# Patient Record
Sex: Female | Born: 1935 | Race: White | Hispanic: No | Smoking: Never smoker
Health system: Southern US, Community
[De-identification: ages and names within clinical notes are randomized; demographics above are authoritative.]

## PROBLEM LIST (undated history)

## (undated) DIAGNOSIS — D75839 Thrombocytosis, unspecified: Secondary | ICD-10-CM

## (undated) DIAGNOSIS — K5790 Diverticulosis of intestine, part unspecified, without perforation or abscess without bleeding: Secondary | ICD-10-CM

## (undated) DIAGNOSIS — I10 Essential (primary) hypertension: Secondary | ICD-10-CM

## (undated) DIAGNOSIS — N189 Chronic kidney disease, unspecified: Secondary | ICD-10-CM

## (undated) DIAGNOSIS — M810 Age-related osteoporosis without current pathological fracture: Secondary | ICD-10-CM

## (undated) DIAGNOSIS — D473 Essential (hemorrhagic) thrombocythemia: Secondary | ICD-10-CM

## (undated) DIAGNOSIS — I214 Non-ST elevation (NSTEMI) myocardial infarction: Secondary | ICD-10-CM

## (undated) DIAGNOSIS — K922 Gastrointestinal hemorrhage, unspecified: Secondary | ICD-10-CM

## (undated) DIAGNOSIS — I2510 Atherosclerotic heart disease of native coronary artery without angina pectoris: Secondary | ICD-10-CM

## (undated) DIAGNOSIS — G459 Transient cerebral ischemic attack, unspecified: Secondary | ICD-10-CM

## (undated) DIAGNOSIS — K219 Gastro-esophageal reflux disease without esophagitis: Secondary | ICD-10-CM

## (undated) DIAGNOSIS — D649 Anemia, unspecified: Secondary | ICD-10-CM

## (undated) HISTORY — PX: ABDOMINAL HYSTERECTOMY: SHX81

## (undated) HISTORY — DX: Essential (hemorrhagic) thrombocythemia: D47.3

## (undated) HISTORY — PX: COLONOSCOPY: SHX174

---

## 2003-09-11 ENCOUNTER — Other Ambulatory Visit: Payer: Self-pay

## 2004-03-25 ENCOUNTER — Other Ambulatory Visit: Payer: Self-pay

## 2004-05-31 ENCOUNTER — Ambulatory Visit: Payer: Self-pay | Admitting: Internal Medicine

## 2004-07-01 ENCOUNTER — Ambulatory Visit: Payer: Self-pay | Admitting: Internal Medicine

## 2004-07-31 ENCOUNTER — Ambulatory Visit: Payer: Self-pay | Admitting: Internal Medicine

## 2004-08-31 ENCOUNTER — Ambulatory Visit: Payer: Self-pay | Admitting: Internal Medicine

## 2004-10-01 ENCOUNTER — Ambulatory Visit: Payer: Self-pay | Admitting: Internal Medicine

## 2004-10-29 ENCOUNTER — Ambulatory Visit: Payer: Self-pay | Admitting: Internal Medicine

## 2004-11-29 ENCOUNTER — Ambulatory Visit: Payer: Self-pay | Admitting: Internal Medicine

## 2004-12-29 ENCOUNTER — Ambulatory Visit: Payer: Self-pay | Admitting: Internal Medicine

## 2005-01-29 ENCOUNTER — Ambulatory Visit: Payer: Self-pay | Admitting: Internal Medicine

## 2005-02-28 ENCOUNTER — Ambulatory Visit: Payer: Self-pay | Admitting: Internal Medicine

## 2005-04-07 ENCOUNTER — Ambulatory Visit: Payer: Self-pay | Admitting: Internal Medicine

## 2005-05-01 ENCOUNTER — Ambulatory Visit: Payer: Self-pay | Admitting: Internal Medicine

## 2005-06-02 ENCOUNTER — Ambulatory Visit: Payer: Self-pay | Admitting: Internal Medicine

## 2005-07-01 ENCOUNTER — Ambulatory Visit: Payer: Self-pay | Admitting: Internal Medicine

## 2005-07-31 ENCOUNTER — Ambulatory Visit: Payer: Self-pay | Admitting: Internal Medicine

## 2005-09-15 ENCOUNTER — Ambulatory Visit: Payer: Self-pay | Admitting: Internal Medicine

## 2005-10-01 ENCOUNTER — Ambulatory Visit: Payer: Self-pay | Admitting: Internal Medicine

## 2005-11-03 ENCOUNTER — Ambulatory Visit: Payer: Self-pay | Admitting: Internal Medicine

## 2005-11-29 ENCOUNTER — Ambulatory Visit: Payer: Self-pay | Admitting: Internal Medicine

## 2005-12-29 ENCOUNTER — Ambulatory Visit: Payer: Self-pay | Admitting: Internal Medicine

## 2006-01-29 ENCOUNTER — Ambulatory Visit: Payer: Self-pay | Admitting: Internal Medicine

## 2006-02-28 ENCOUNTER — Ambulatory Visit: Payer: Self-pay | Admitting: Internal Medicine

## 2006-03-26 ENCOUNTER — Ambulatory Visit: Payer: Self-pay | Admitting: Unknown Physician Specialty

## 2006-04-16 ENCOUNTER — Ambulatory Visit: Payer: Self-pay | Admitting: Internal Medicine

## 2006-05-01 ENCOUNTER — Ambulatory Visit: Payer: Self-pay | Admitting: Internal Medicine

## 2006-06-15 ENCOUNTER — Ambulatory Visit: Payer: Self-pay | Admitting: Internal Medicine

## 2006-07-01 ENCOUNTER — Ambulatory Visit: Payer: Self-pay | Admitting: Internal Medicine

## 2006-08-10 ENCOUNTER — Ambulatory Visit: Payer: Self-pay | Admitting: Internal Medicine

## 2006-08-31 ENCOUNTER — Ambulatory Visit: Payer: Self-pay | Admitting: Internal Medicine

## 2006-09-13 ENCOUNTER — Other Ambulatory Visit: Payer: Self-pay

## 2006-09-13 ENCOUNTER — Inpatient Hospital Stay: Payer: Self-pay | Admitting: Internal Medicine

## 2006-09-15 ENCOUNTER — Other Ambulatory Visit: Payer: Self-pay

## 2006-10-05 ENCOUNTER — Ambulatory Visit: Payer: Self-pay | Admitting: Internal Medicine

## 2006-10-30 ENCOUNTER — Ambulatory Visit: Payer: Self-pay | Admitting: Internal Medicine

## 2006-11-30 ENCOUNTER — Ambulatory Visit: Payer: Self-pay | Admitting: Internal Medicine

## 2006-12-30 ENCOUNTER — Ambulatory Visit: Payer: Self-pay | Admitting: Internal Medicine

## 2007-01-30 ENCOUNTER — Ambulatory Visit: Payer: Self-pay | Admitting: Internal Medicine

## 2007-03-01 ENCOUNTER — Ambulatory Visit: Payer: Self-pay | Admitting: Internal Medicine

## 2007-04-01 ENCOUNTER — Ambulatory Visit: Payer: Self-pay | Admitting: Internal Medicine

## 2007-05-31 ENCOUNTER — Ambulatory Visit: Payer: Self-pay | Admitting: Internal Medicine

## 2007-06-01 ENCOUNTER — Ambulatory Visit: Payer: Self-pay | Admitting: Internal Medicine

## 2007-07-26 ENCOUNTER — Ambulatory Visit: Payer: Self-pay | Admitting: Internal Medicine

## 2007-08-01 ENCOUNTER — Ambulatory Visit: Payer: Self-pay | Admitting: Internal Medicine

## 2007-09-26 ENCOUNTER — Ambulatory Visit: Payer: Self-pay | Admitting: Internal Medicine

## 2007-10-02 ENCOUNTER — Ambulatory Visit: Payer: Self-pay | Admitting: Internal Medicine

## 2007-11-29 ENCOUNTER — Ambulatory Visit: Payer: Self-pay | Admitting: Internal Medicine

## 2007-11-30 ENCOUNTER — Ambulatory Visit: Payer: Self-pay | Admitting: Internal Medicine

## 2008-01-24 ENCOUNTER — Ambulatory Visit: Payer: Self-pay | Admitting: Internal Medicine

## 2008-01-30 ENCOUNTER — Ambulatory Visit: Payer: Self-pay | Admitting: Internal Medicine

## 2008-03-20 ENCOUNTER — Ambulatory Visit: Payer: Self-pay | Admitting: Internal Medicine

## 2008-03-31 ENCOUNTER — Ambulatory Visit: Payer: Self-pay | Admitting: Internal Medicine

## 2008-05-22 ENCOUNTER — Ambulatory Visit: Payer: Self-pay | Admitting: Internal Medicine

## 2008-05-31 ENCOUNTER — Ambulatory Visit: Payer: Self-pay | Admitting: Internal Medicine

## 2008-07-01 ENCOUNTER — Ambulatory Visit: Payer: Self-pay | Admitting: Internal Medicine

## 2008-07-17 ENCOUNTER — Ambulatory Visit: Payer: Self-pay | Admitting: Internal Medicine

## 2008-07-31 ENCOUNTER — Ambulatory Visit: Payer: Self-pay | Admitting: Internal Medicine

## 2008-08-31 ENCOUNTER — Ambulatory Visit: Payer: Self-pay | Admitting: Internal Medicine

## 2008-09-18 ENCOUNTER — Ambulatory Visit: Payer: Self-pay | Admitting: Internal Medicine

## 2008-10-01 ENCOUNTER — Ambulatory Visit: Payer: Self-pay | Admitting: Internal Medicine

## 2008-10-29 ENCOUNTER — Ambulatory Visit: Payer: Self-pay | Admitting: Internal Medicine

## 2008-11-13 ENCOUNTER — Ambulatory Visit: Payer: Self-pay | Admitting: Internal Medicine

## 2008-11-29 ENCOUNTER — Ambulatory Visit: Payer: Self-pay | Admitting: Internal Medicine

## 2008-12-29 ENCOUNTER — Ambulatory Visit: Payer: Self-pay | Admitting: Internal Medicine

## 2009-01-16 ENCOUNTER — Ambulatory Visit: Payer: Self-pay | Admitting: Ophthalmology

## 2009-01-16 ENCOUNTER — Ambulatory Visit: Payer: Self-pay | Admitting: Cardiology

## 2009-01-29 ENCOUNTER — Ambulatory Visit: Payer: Self-pay | Admitting: Ophthalmology

## 2009-01-30 ENCOUNTER — Ambulatory Visit: Payer: Self-pay | Admitting: Internal Medicine

## 2009-02-28 ENCOUNTER — Ambulatory Visit: Payer: Self-pay | Admitting: Internal Medicine

## 2009-03-31 ENCOUNTER — Ambulatory Visit: Payer: Self-pay | Admitting: Internal Medicine

## 2009-05-01 ENCOUNTER — Ambulatory Visit: Payer: Self-pay | Admitting: Internal Medicine

## 2009-05-31 ENCOUNTER — Ambulatory Visit: Payer: Self-pay | Admitting: Internal Medicine

## 2009-07-19 ENCOUNTER — Ambulatory Visit: Payer: Self-pay | Admitting: Internal Medicine

## 2009-07-31 ENCOUNTER — Ambulatory Visit: Payer: Self-pay | Admitting: Internal Medicine

## 2009-08-31 ENCOUNTER — Ambulatory Visit: Payer: Self-pay | Admitting: Internal Medicine

## 2009-09-19 ENCOUNTER — Ambulatory Visit: Payer: Self-pay | Admitting: Internal Medicine

## 2009-10-01 ENCOUNTER — Ambulatory Visit: Payer: Self-pay | Admitting: Internal Medicine

## 2009-10-29 ENCOUNTER — Ambulatory Visit: Payer: Self-pay | Admitting: Internal Medicine

## 2009-11-29 ENCOUNTER — Ambulatory Visit: Payer: Self-pay | Admitting: Internal Medicine

## 2010-01-09 ENCOUNTER — Ambulatory Visit: Payer: Self-pay | Admitting: Internal Medicine

## 2010-01-29 ENCOUNTER — Ambulatory Visit: Payer: Self-pay | Admitting: Internal Medicine

## 2010-02-28 ENCOUNTER — Ambulatory Visit: Payer: Self-pay | Admitting: Internal Medicine

## 2010-04-17 ENCOUNTER — Ambulatory Visit: Payer: Self-pay | Admitting: Internal Medicine

## 2010-05-01 ENCOUNTER — Ambulatory Visit: Payer: Self-pay | Admitting: Internal Medicine

## 2010-06-17 ENCOUNTER — Ambulatory Visit: Payer: Self-pay | Admitting: Internal Medicine

## 2010-07-01 ENCOUNTER — Ambulatory Visit: Payer: Self-pay | Admitting: Internal Medicine

## 2010-08-18 ENCOUNTER — Ambulatory Visit: Payer: Self-pay | Admitting: Internal Medicine

## 2010-08-31 ENCOUNTER — Ambulatory Visit: Payer: Self-pay | Admitting: Internal Medicine

## 2010-10-20 ENCOUNTER — Ambulatory Visit: Payer: Self-pay | Admitting: Internal Medicine

## 2010-10-30 ENCOUNTER — Ambulatory Visit: Payer: Self-pay | Admitting: Internal Medicine

## 2010-12-19 ENCOUNTER — Ambulatory Visit: Payer: Self-pay | Admitting: Internal Medicine

## 2010-12-30 ENCOUNTER — Ambulatory Visit: Payer: Self-pay | Admitting: Internal Medicine

## 2011-02-18 ENCOUNTER — Ambulatory Visit: Payer: Self-pay | Admitting: Internal Medicine

## 2011-03-01 ENCOUNTER — Ambulatory Visit: Payer: Self-pay | Admitting: Internal Medicine

## 2011-04-22 ENCOUNTER — Ambulatory Visit: Payer: Self-pay | Admitting: Internal Medicine

## 2011-05-02 ENCOUNTER — Ambulatory Visit: Payer: Self-pay | Admitting: Internal Medicine

## 2011-06-01 ENCOUNTER — Ambulatory Visit: Payer: Self-pay | Admitting: Internal Medicine

## 2011-07-02 ENCOUNTER — Ambulatory Visit: Payer: Self-pay | Admitting: Internal Medicine

## 2011-08-01 ENCOUNTER — Ambulatory Visit: Payer: Self-pay | Admitting: Internal Medicine

## 2011-08-05 ENCOUNTER — Ambulatory Visit: Payer: Self-pay | Admitting: Internal Medicine

## 2011-09-01 ENCOUNTER — Ambulatory Visit: Payer: Self-pay | Admitting: Internal Medicine

## 2011-10-06 ENCOUNTER — Ambulatory Visit: Payer: Self-pay | Admitting: Internal Medicine

## 2011-10-06 LAB — CBC CANCER CENTER
Basophil #: 0 x10 3/mm (ref 0.0–0.1)
Eosinophil #: 0 x10 3/mm (ref 0.0–0.7)
MCH: 39.3 pg — ABNORMAL HIGH (ref 26.0–34.0)
MCHC: 34.8 g/dL (ref 32.0–36.0)
MCV: 113 fL — ABNORMAL HIGH (ref 80–100)
Monocyte #: 0.3 x10 3/mm (ref 0.0–0.7)
Neutrophil %: 68.5 %
Platelet: 429 x10 3/mm (ref 150–440)

## 2011-10-30 ENCOUNTER — Ambulatory Visit: Payer: Self-pay | Admitting: Internal Medicine

## 2011-12-04 ENCOUNTER — Ambulatory Visit: Payer: Self-pay | Admitting: Internal Medicine

## 2011-12-04 LAB — CBC CANCER CENTER
Basophil %: 0.3 %
Eosinophil %: 0.5 %
Lymphocyte %: 28.4 %
MCH: 38.9 pg — ABNORMAL HIGH (ref 26.0–34.0)
Monocyte %: 8.2 %
Platelet: 350 x10 3/mm (ref 150–440)
RBC: 2.69 10*6/uL — ABNORMAL LOW (ref 3.80–5.20)
WBC: 3.1 x10 3/mm — ABNORMAL LOW (ref 3.6–11.0)

## 2011-12-30 ENCOUNTER — Ambulatory Visit: Payer: Self-pay | Admitting: Internal Medicine

## 2012-01-01 LAB — CBC CANCER CENTER
Eosinophil %: 0.7 %
HCT: 29.1 % — ABNORMAL LOW (ref 35.0–47.0)
Lymphocyte #: 1 x10 3/mm (ref 1.0–3.6)
Lymphocyte %: 29.2 %
MCH: 38 pg — ABNORMAL HIGH (ref 26.0–34.0)
MCHC: 32.8 g/dL (ref 32.0–36.0)
Monocyte #: 0.4 x10 3/mm (ref 0.2–0.9)
Monocyte %: 10.8 %
Neutrophil %: 59 %
Platelet: 298 x10 3/mm (ref 150–440)
RBC: 2.51 10*6/uL — ABNORMAL LOW (ref 3.80–5.20)
RDW: 13.7 % (ref 11.5–14.5)
WBC: 3.3 x10 3/mm — ABNORMAL LOW (ref 3.6–11.0)

## 2012-01-15 LAB — CBC CANCER CENTER
Basophil #: 0 x10 3/mm (ref 0.0–0.1)
Eosinophil %: 0.7 %
HCT: 29.3 % — ABNORMAL LOW (ref 35.0–47.0)
HGB: 9.8 g/dL — ABNORMAL LOW (ref 12.0–16.0)
Lymphocyte #: 0.9 x10 3/mm — ABNORMAL LOW (ref 1.0–3.6)
Lymphocyte %: 28.5 %
MCH: 38.7 pg — ABNORMAL HIGH (ref 26.0–34.0)
MCHC: 33.5 g/dL (ref 32.0–36.0)
Monocyte %: 10.2 %
Platelet: 329 x10 3/mm (ref 150–440)
RBC: 2.53 10*6/uL — ABNORMAL LOW (ref 3.80–5.20)
WBC: 3.2 x10 3/mm — ABNORMAL LOW (ref 3.6–11.0)

## 2012-01-15 LAB — IRON AND TIBC
Iron Bind.Cap.(Total): 319 ug/dL (ref 250–450)
Iron: 86 ug/dL (ref 50–170)
Unbound Iron-Bind.Cap.: 233 ug/dL

## 2012-01-29 LAB — CBC CANCER CENTER
Basophil #: 0 x10 3/mm (ref 0.0–0.1)
Eosinophil %: 0.8 %
Lymphocyte #: 1 x10 3/mm (ref 1.0–3.6)
MCH: 38.3 pg — ABNORMAL HIGH (ref 26.0–34.0)
Monocyte %: 10.1 %
Neutrophil %: 57.1 %
Platelet: 279 x10 3/mm (ref 150–440)
RBC: 2.7 10*6/uL — ABNORMAL LOW (ref 3.80–5.20)
WBC: 3.1 x10 3/mm — ABNORMAL LOW (ref 3.6–11.0)

## 2012-01-30 ENCOUNTER — Ambulatory Visit: Payer: Self-pay | Admitting: Internal Medicine

## 2012-02-15 LAB — CBC CANCER CENTER
Basophil #: 0 x10 3/mm (ref 0.0–0.1)
Eosinophil #: 0 x10 3/mm (ref 0.0–0.7)
Eosinophil %: 0.8 %
HCT: 30.7 % — ABNORMAL LOW (ref 35.0–47.0)
HGB: 10.2 g/dL — ABNORMAL LOW (ref 12.0–16.0)
Lymphocyte #: 0.9 x10 3/mm — ABNORMAL LOW (ref 1.0–3.6)
Lymphocyte %: 17.5 %
MCH: 37.9 pg — ABNORMAL HIGH (ref 26.0–34.0)
MCHC: 33.1 g/dL (ref 32.0–36.0)
Neutrophil %: 72.3 %
Platelet: 335 x10 3/mm (ref 150–440)

## 2012-02-29 ENCOUNTER — Ambulatory Visit: Payer: Self-pay | Admitting: Internal Medicine

## 2012-03-07 LAB — CBC CANCER CENTER
Basophil #: 0 x10 3/mm (ref 0.0–0.1)
Basophil %: 0.5 %
Eosinophil #: 0 x10 3/mm (ref 0.0–0.7)
HGB: 10.5 g/dL — ABNORMAL LOW (ref 12.0–16.0)
Lymphocyte #: 1.1 x10 3/mm (ref 1.0–3.6)
MCH: 38 pg — ABNORMAL HIGH (ref 26.0–34.0)
MCHC: 33 g/dL (ref 32.0–36.0)
MCV: 115 fL — ABNORMAL HIGH (ref 80–100)
Monocyte #: 0.4 x10 3/mm (ref 0.2–0.9)
Monocyte %: 9.4 %
Platelet: 380 x10 3/mm (ref 150–440)
RDW: 12.8 % (ref 11.5–14.5)
WBC: 3.9 x10 3/mm (ref 3.6–11.0)

## 2012-03-31 ENCOUNTER — Ambulatory Visit: Payer: Self-pay | Admitting: Internal Medicine

## 2012-04-04 LAB — CBC CANCER CENTER
Basophil #: 0 x10 3/mm (ref 0.0–0.1)
Basophil %: 0.5 %
Eosinophil #: 0 x10 3/mm (ref 0.0–0.7)
HCT: 33.8 % — ABNORMAL LOW (ref 35.0–47.0)
HGB: 11.5 g/dL — ABNORMAL LOW (ref 12.0–16.0)
Lymphocyte #: 1 x10 3/mm (ref 1.0–3.6)
Lymphocyte %: 22.4 %
MCH: 38.4 pg — ABNORMAL HIGH (ref 26.0–34.0)
MCHC: 34.1 g/dL (ref 32.0–36.0)
Monocyte #: 0.3 x10 3/mm (ref 0.2–0.9)
Monocyte %: 7 %
Neutrophil #: 3.2 x10 3/mm (ref 1.4–6.5)
RDW: 12.2 % (ref 11.5–14.5)
WBC: 4.6 x10 3/mm (ref 3.6–11.0)

## 2012-05-01 ENCOUNTER — Ambulatory Visit: Payer: Self-pay | Admitting: Internal Medicine

## 2012-05-30 LAB — CBC CANCER CENTER
Basophil #: 0 x10 3/mm (ref 0.0–0.1)
Basophil %: 0.7 %
Eosinophil #: 0.1 x10 3/mm (ref 0.0–0.7)
HCT: 31.7 % — ABNORMAL LOW (ref 35.0–47.0)
Lymphocyte #: 1.2 x10 3/mm (ref 1.0–3.6)
MCH: 36.7 pg — ABNORMAL HIGH (ref 26.0–34.0)
MCV: 110 fL — ABNORMAL HIGH (ref 80–100)
Monocyte %: 9.9 %
Neutrophil #: 3.4 x10 3/mm (ref 1.4–6.5)
RBC: 2.87 10*6/uL — ABNORMAL LOW (ref 3.80–5.20)
RDW: 13.4 % (ref 11.5–14.5)
WBC: 5.2 x10 3/mm (ref 3.6–11.0)

## 2012-05-31 ENCOUNTER — Ambulatory Visit: Payer: Self-pay | Admitting: Internal Medicine

## 2012-07-11 ENCOUNTER — Ambulatory Visit: Payer: Self-pay | Admitting: Internal Medicine

## 2012-07-11 LAB — CBC CANCER CENTER
Basophil #: 0 x10 3/mm (ref 0.0–0.1)
Basophil %: 0.7 %
Eosinophil #: 0.1 x10 3/mm (ref 0.0–0.7)
HGB: 10.9 g/dL — ABNORMAL LOW (ref 12.0–16.0)
Lymphocyte %: 23.3 %
MCH: 36.7 pg — ABNORMAL HIGH (ref 26.0–34.0)
MCHC: 33.4 g/dL (ref 32.0–36.0)
MCV: 110 fL — ABNORMAL HIGH (ref 80–100)
Monocyte #: 0.5 x10 3/mm (ref 0.2–0.9)
Monocyte %: 9.5 %
Neutrophil %: 64.5 %
RDW: 13.9 % (ref 11.5–14.5)
WBC: 4.8 x10 3/mm (ref 3.6–11.0)

## 2012-07-31 ENCOUNTER — Ambulatory Visit: Payer: Self-pay | Admitting: Internal Medicine

## 2012-08-27 ENCOUNTER — Inpatient Hospital Stay: Payer: Self-pay

## 2012-08-27 LAB — COMPREHENSIVE METABOLIC PANEL
Alkaline Phosphatase: 80 U/L (ref 50–136)
Anion Gap: 9 (ref 7–16)
Bilirubin,Total: 0.5 mg/dL (ref 0.2–1.0)
Chloride: 107 mmol/L (ref 98–107)
Co2: 25 mmol/L (ref 21–32)
Creatinine: 1.31 mg/dL — ABNORMAL HIGH (ref 0.60–1.30)
EGFR (Non-African Amer.): 36 — ABNORMAL LOW
Osmolality: 286 (ref 275–301)
Potassium: 3.6 mmol/L (ref 3.5–5.1)
SGPT (ALT): 15 U/L (ref 12–78)
Sodium: 141 mmol/L (ref 136–145)
Total Protein: 7.7 g/dL (ref 6.4–8.2)

## 2012-08-27 LAB — URINALYSIS, COMPLETE
Hyaline Cast: 3
Ketone: NEGATIVE
Nitrite: NEGATIVE
Protein: 100
RBC,UR: 1 /HPF (ref 0–5)
Specific Gravity: 1.013 (ref 1.003–1.030)
Squamous Epithelial: 2
WBC UR: 4 /HPF (ref 0–5)

## 2012-08-27 LAB — CBC
MCH: 37 pg — ABNORMAL HIGH (ref 26.0–34.0)
MCHC: 34.3 g/dL (ref 32.0–36.0)
Platelet: 414 10*3/uL (ref 150–440)
RBC: 3.24 10*6/uL — ABNORMAL LOW (ref 3.80–5.20)
WBC: 4.2 10*3/uL (ref 3.6–11.0)

## 2012-08-27 LAB — LIPASE, BLOOD: Lipase: 310 U/L (ref 73–393)

## 2012-08-27 LAB — CK TOTAL AND CKMB (NOT AT ARMC)
CK, Total: 36 U/L (ref 21–215)
CK-MB: 1.3 ng/mL (ref 0.5–3.6)

## 2012-08-27 LAB — TROPONIN I: Troponin-I: 0.06 ng/mL — ABNORMAL HIGH

## 2012-08-27 LAB — TSH: Thyroid Stimulating Horm: 5.52 u[IU]/mL — ABNORMAL HIGH

## 2012-08-27 LAB — PRO B NATRIURETIC PEPTIDE: B-Type Natriuretic Peptide: 1523 pg/mL — ABNORMAL HIGH (ref 0–450)

## 2012-08-28 LAB — CBC WITH DIFFERENTIAL/PLATELET
Basophil %: 0.6 %
Eosinophil #: 0.1 10*3/uL (ref 0.0–0.7)
Eosinophil %: 1.4 %
HGB: 10.8 g/dL — ABNORMAL LOW (ref 12.0–16.0)
Lymphocyte %: 22 %
MCH: 36.2 pg — ABNORMAL HIGH (ref 26.0–34.0)
MCHC: 33.7 g/dL (ref 32.0–36.0)
Neutrophil #: 3.8 10*3/uL (ref 1.4–6.5)
Neutrophil %: 65.5 %
Platelet: 402 10*3/uL (ref 150–440)
RDW: 14 % (ref 11.5–14.5)

## 2012-08-28 LAB — BASIC METABOLIC PANEL
Chloride: 105 mmol/L (ref 98–107)
EGFR (Non-African Amer.): 33 — ABNORMAL LOW
Osmolality: 292 (ref 275–301)
Potassium: 3.3 mmol/L — ABNORMAL LOW (ref 3.5–5.1)
Sodium: 143 mmol/L (ref 136–145)

## 2012-08-28 LAB — CK-MB: CK-MB: 20.8 ng/mL — ABNORMAL HIGH (ref 0.5–3.6)

## 2012-08-29 LAB — CBC WITH DIFFERENTIAL/PLATELET
Basophil #: 0 10*3/uL (ref 0.0–0.1)
Basophil %: 0.5 %
Eosinophil %: 2 %
HCT: 30.8 % — ABNORMAL LOW (ref 35.0–47.0)
HGB: 10.4 g/dL — ABNORMAL LOW (ref 12.0–16.0)
Lymphocyte %: 33.8 %
Monocyte %: 11 %
Neutrophil #: 2.6 10*3/uL (ref 1.4–6.5)
RBC: 2.84 10*6/uL — ABNORMAL LOW (ref 3.80–5.20)
RDW: 13.9 % (ref 11.5–14.5)
WBC: 4.9 10*3/uL (ref 3.6–11.0)

## 2012-08-29 LAB — BASIC METABOLIC PANEL
Anion Gap: 9 (ref 7–16)
BUN: 41 mg/dL — ABNORMAL HIGH (ref 7–18)
Chloride: 108 mmol/L — ABNORMAL HIGH (ref 98–107)
Co2: 25 mmol/L (ref 21–32)
Creatinine: 1.72 mg/dL — ABNORMAL HIGH (ref 0.60–1.30)
Glucose: 112 mg/dL — ABNORMAL HIGH (ref 65–99)
Potassium: 3.9 mmol/L (ref 3.5–5.1)
Sodium: 142 mmol/L (ref 136–145)

## 2012-09-06 ENCOUNTER — Inpatient Hospital Stay: Payer: Self-pay | Admitting: Family Medicine

## 2012-09-06 LAB — COMPREHENSIVE METABOLIC PANEL
Albumin: 3.8 g/dL (ref 3.4–5.0)
Alkaline Phosphatase: 68 U/L (ref 50–136)
Anion Gap: 13 (ref 7–16)
BUN: 44 mg/dL — ABNORMAL HIGH (ref 7–18)
Bilirubin,Total: 0.2 mg/dL (ref 0.2–1.0)
Co2: 21 mmol/L (ref 21–32)
EGFR (African American): 30 — ABNORMAL LOW
SGPT (ALT): 16 U/L (ref 12–78)
Total Protein: 6.8 g/dL (ref 6.4–8.2)

## 2012-09-06 LAB — CBC
HCT: 28.7 % — ABNORMAL LOW (ref 35.0–47.0)
HGB: 9.6 g/dL — ABNORMAL LOW (ref 12.0–16.0)
MCH: 36.5 pg — ABNORMAL HIGH (ref 26.0–34.0)
MCHC: 33.3 g/dL (ref 32.0–36.0)
MCV: 110 fL — ABNORMAL HIGH (ref 80–100)
Platelet: 485 10*3/uL — ABNORMAL HIGH (ref 150–440)
RBC: 2.62 10*6/uL — ABNORMAL LOW (ref 3.80–5.20)
RDW: 13.9 % (ref 11.5–14.5)
WBC: 5.3 10*3/uL (ref 3.6–11.0)

## 2012-09-06 LAB — APTT
Activated PTT: 33.7 secs (ref 23.6–35.9)
Activated PTT: 57 secs — ABNORMAL HIGH (ref 23.6–35.9)

## 2012-09-06 LAB — CK TOTAL AND CKMB (NOT AT ARMC)
CK, Total: 31 U/L (ref 21–215)
CK-MB: 1.4 ng/mL (ref 0.5–3.6)
CK-MB: 1.8 ng/mL (ref 0.5–3.6)

## 2012-09-06 LAB — PROTIME-INR
INR: 1
Prothrombin Time: 13.9 secs (ref 11.5–14.7)

## 2012-09-06 LAB — TROPONIN I
Troponin-I: 0.12 ng/mL — ABNORMAL HIGH
Troponin-I: 0.12 ng/mL — ABNORMAL HIGH

## 2012-09-07 LAB — BASIC METABOLIC PANEL
Calcium, Total: 9 mg/dL (ref 8.5–10.1)
Creatinine: 1.64 mg/dL — ABNORMAL HIGH (ref 0.60–1.30)
EGFR (Non-African Amer.): 27 — ABNORMAL LOW
Osmolality: 291 (ref 275–301)
Potassium: 4.2 mmol/L (ref 3.5–5.1)
Sodium: 142 mmol/L (ref 136–145)

## 2012-09-07 LAB — CBC WITH DIFFERENTIAL/PLATELET
Basophil #: 0 10*3/uL (ref 0.0–0.1)
Basophil %: 0.5 %
Eosinophil %: 1 %
HCT: 27.7 % — ABNORMAL LOW (ref 35.0–47.0)
HGB: 9.4 g/dL — ABNORMAL LOW (ref 12.0–16.0)
Lymphocyte #: 1.3 10*3/uL (ref 1.0–3.6)
MCHC: 34.1 g/dL (ref 32.0–36.0)
MCV: 110 fL — ABNORMAL HIGH (ref 80–100)
Monocyte #: 0.4 x10 3/mm (ref 0.2–0.9)
Monocyte %: 7.8 %
Neutrophil %: 62.9 %
Platelet: 454 10*3/uL — ABNORMAL HIGH (ref 150–440)
RBC: 2.53 10*6/uL — ABNORMAL LOW (ref 3.80–5.20)
RDW: 13.8 % (ref 11.5–14.5)

## 2012-09-07 LAB — LIPID PANEL
Triglycerides: 148 mg/dL (ref 0–200)
VLDL Cholesterol, Calc: 30 mg/dL (ref 5–40)

## 2012-09-07 LAB — URINE CULTURE

## 2012-09-08 LAB — CBC WITH DIFFERENTIAL/PLATELET
Eosinophil #: 0.1 10*3/uL (ref 0.0–0.7)
Eosinophil %: 2.7 %
HCT: 24.6 % — ABNORMAL LOW (ref 35.0–47.0)
HGB: 7.8 g/dL — ABNORMAL LOW (ref 12.0–16.0)
Lymphocyte #: 1.1 10*3/uL (ref 1.0–3.6)
MCHC: 31.7 g/dL — ABNORMAL LOW (ref 32.0–36.0)
MCV: 109 fL — ABNORMAL HIGH (ref 80–100)
Neutrophil %: 56.4 %
Platelet: 401 10*3/uL (ref 150–440)
RBC: 2.25 10*6/uL — ABNORMAL LOW (ref 3.80–5.20)
RDW: 13.6 % (ref 11.5–14.5)

## 2012-09-08 LAB — BASIC METABOLIC PANEL
Anion Gap: 8 (ref 7–16)
BUN: 36 mg/dL — ABNORMAL HIGH (ref 7–18)
Creatinine: 1.75 mg/dL — ABNORMAL HIGH (ref 0.60–1.30)
EGFR (Non-African Amer.): 25 — ABNORMAL LOW
Glucose: 111 mg/dL — ABNORMAL HIGH (ref 65–99)
Osmolality: 294 (ref 275–301)
Potassium: 4.1 mmol/L (ref 3.5–5.1)
Sodium: 143 mmol/L (ref 136–145)

## 2012-09-08 LAB — OCCULT BLOOD X 1 CARD TO LAB, STOOL: Occult Blood, Feces: POSITIVE

## 2012-09-09 LAB — CBC WITH DIFFERENTIAL/PLATELET
Basophil %: 0.4 %
Eosinophil %: 3.1 %
HCT: 24.6 % — ABNORMAL LOW (ref 35.0–47.0)
HGB: 8.2 g/dL — ABNORMAL LOW (ref 12.0–16.0)
Lymphocyte #: 1 10*3/uL (ref 1.0–3.6)
Lymphocyte %: 21.8 %
MCH: 36.6 pg — ABNORMAL HIGH (ref 26.0–34.0)
MCHC: 33.4 g/dL (ref 32.0–36.0)
MCV: 110 fL — ABNORMAL HIGH (ref 80–100)
Monocyte #: 0.4 x10 3/mm (ref 0.2–0.9)
Monocyte %: 8.9 %
Neutrophil %: 65.8 %
Platelet: 371 10*3/uL (ref 150–440)
RBC: 2.25 10*6/uL — ABNORMAL LOW (ref 3.80–5.20)
RDW: 14.1 % (ref 11.5–14.5)

## 2012-09-09 LAB — BASIC METABOLIC PANEL
Anion Gap: 9 (ref 7–16)
BUN: 33 mg/dL — ABNORMAL HIGH (ref 7–18)
Chloride: 113 mmol/L — ABNORMAL HIGH (ref 98–107)
Co2: 22 mmol/L (ref 21–32)
Creatinine: 1.56 mg/dL — ABNORMAL HIGH (ref 0.60–1.30)
Glucose: 109 mg/dL — ABNORMAL HIGH (ref 65–99)
Osmolality: 295 (ref 275–301)
Sodium: 144 mmol/L (ref 136–145)

## 2012-09-10 LAB — IRON AND TIBC
Iron Bind.Cap.(Total): 241 ug/dL — ABNORMAL LOW (ref 250–450)
Iron Saturation: 41 %
Iron: 98 ug/dL (ref 50–170)
Unbound Iron-Bind.Cap.: 143 ug/dL

## 2012-09-10 LAB — CK-MB: CK-MB: 0.9 ng/mL (ref 0.5–3.6)

## 2012-09-10 LAB — CBC WITH DIFFERENTIAL/PLATELET
Basophil #: 0 10*3/uL (ref 0.0–0.1)
Eosinophil %: 2.3 %
HCT: 26.2 % — ABNORMAL LOW (ref 35.0–47.0)
HGB: 9 g/dL — ABNORMAL LOW (ref 12.0–16.0)
Lymphocyte %: 16.2 %
MCH: 35.7 pg — ABNORMAL HIGH (ref 26.0–34.0)
Monocyte #: 0.4 x10 3/mm (ref 0.2–0.9)
Neutrophil #: 4.2 10*3/uL (ref 1.4–6.5)
Neutrophil %: 73.3 %
Platelet: 331 10*3/uL (ref 150–440)
RBC: 2.53 10*6/uL — ABNORMAL LOW (ref 3.80–5.20)
RDW: 17.4 % — ABNORMAL HIGH (ref 11.5–14.5)
WBC: 5.7 10*3/uL (ref 3.6–11.0)

## 2012-09-10 LAB — BASIC METABOLIC PANEL
Co2: 22 mmol/L (ref 21–32)
Creatinine: 1.46 mg/dL — ABNORMAL HIGH (ref 0.60–1.30)
EGFR (Non-African Amer.): 31 — ABNORMAL LOW
Glucose: 99 mg/dL (ref 65–99)
Osmolality: 293 (ref 275–301)
Potassium: 3.7 mmol/L (ref 3.5–5.1)

## 2012-09-10 LAB — FERRITIN: Ferritin (ARMC): 67 ng/mL (ref 8–388)

## 2012-09-11 LAB — BASIC METABOLIC PANEL
Anion Gap: 8 (ref 7–16)
BUN: 27 mg/dL — ABNORMAL HIGH (ref 7–18)
Calcium, Total: 8.2 mg/dL — ABNORMAL LOW (ref 8.5–10.1)
Chloride: 114 mmol/L — ABNORMAL HIGH (ref 98–107)
Co2: 21 mmol/L (ref 21–32)
EGFR (African American): 39 — ABNORMAL LOW
EGFR (Non-African Amer.): 34 — ABNORMAL LOW
Glucose: 103 mg/dL — ABNORMAL HIGH (ref 65–99)
Osmolality: 290 (ref 275–301)
Sodium: 143 mmol/L (ref 136–145)

## 2012-09-14 ENCOUNTER — Emergency Department: Payer: Self-pay | Admitting: Emergency Medicine

## 2012-09-14 LAB — BASIC METABOLIC PANEL
Anion Gap: 6 — ABNORMAL LOW (ref 7–16)
BUN: 27 mg/dL — ABNORMAL HIGH (ref 7–18)
Calcium, Total: 9 mg/dL (ref 8.5–10.1)
Chloride: 111 mmol/L — ABNORMAL HIGH (ref 98–107)
Co2: 24 mmol/L (ref 21–32)
Creatinine: 1.25 mg/dL (ref 0.60–1.30)
EGFR (Non-African Amer.): 38 — ABNORMAL LOW
Glucose: 105 mg/dL — ABNORMAL HIGH (ref 65–99)
Osmolality: 287 (ref 275–301)
Potassium: 4.2 mmol/L (ref 3.5–5.1)

## 2012-09-14 LAB — TROPONIN I: Troponin-I: 0.04 ng/mL

## 2012-09-14 LAB — CK TOTAL AND CKMB (NOT AT ARMC): CK, Total: 49 U/L (ref 21–215)

## 2012-09-14 LAB — CBC
HGB: 11.3 g/dL — ABNORMAL LOW (ref 12.0–16.0)
MCH: 34.2 pg — ABNORMAL HIGH (ref 26.0–34.0)
Platelet: 474 10*3/uL — ABNORMAL HIGH (ref 150–440)
RBC: 3.31 10*6/uL — ABNORMAL LOW (ref 3.80–5.20)
WBC: 6.4 10*3/uL (ref 3.6–11.0)

## 2012-09-14 LAB — PRO B NATRIURETIC PEPTIDE: B-Type Natriuretic Peptide: 6413 pg/mL — ABNORMAL HIGH (ref 0–450)

## 2012-10-21 ENCOUNTER — Ambulatory Visit: Payer: Self-pay | Admitting: Internal Medicine

## 2012-10-25 LAB — CBC CANCER CENTER
HCT: 31.7 % — ABNORMAL LOW (ref 35.0–47.0)
HGB: 11.2 g/dL — ABNORMAL LOW (ref 12.0–16.0)
Lymphocyte #: 1.2 x10 3/mm (ref 1.0–3.6)
Lymphocyte %: 20.1 %
MCHC: 35.3 g/dL (ref 32.0–36.0)
MCV: 102 fL — ABNORMAL HIGH (ref 80–100)
Neutrophil #: 4.4 x10 3/mm (ref 1.4–6.5)
Platelet: 389 x10 3/mm (ref 150–440)
RBC: 3.1 10*6/uL — ABNORMAL LOW (ref 3.80–5.20)
RDW: 19.4 % — ABNORMAL HIGH (ref 11.5–14.5)
WBC: 6.1 x10 3/mm (ref 3.6–11.0)

## 2012-10-29 ENCOUNTER — Ambulatory Visit: Payer: Self-pay | Admitting: Internal Medicine

## 2012-11-16 LAB — CBC CANCER CENTER
Basophil %: 0.8 %
Eosinophil %: 1.9 %
HCT: 30.2 % — ABNORMAL LOW (ref 35.0–47.0)
HGB: 10.5 g/dL — ABNORMAL LOW (ref 12.0–16.0)
Lymphocyte %: 25.7 %
MCH: 36.6 pg — ABNORMAL HIGH (ref 26.0–34.0)
MCHC: 34.9 g/dL (ref 32.0–36.0)
Neutrophil %: 62.4 %
Platelet: 547 x10 3/mm — ABNORMAL HIGH (ref 150–440)
WBC: 6 x10 3/mm (ref 3.6–11.0)

## 2012-11-29 ENCOUNTER — Ambulatory Visit: Payer: Self-pay | Admitting: Internal Medicine

## 2012-11-30 LAB — CBC CANCER CENTER
Basophil #: 0.1 x10 3/mm (ref 0.0–0.1)
Eosinophil %: 1.7 %
HCT: 29.3 % — ABNORMAL LOW (ref 35.0–47.0)
Lymphocyte %: 26.7 %
MCH: 36.3 pg — ABNORMAL HIGH (ref 26.0–34.0)
MCHC: 33.6 g/dL (ref 32.0–36.0)
Monocyte #: 0.5 x10 3/mm (ref 0.2–0.9)
Neutrophil %: 61.9 %
Platelet: 423 x10 3/mm (ref 150–440)
RDW: 19.9 % — ABNORMAL HIGH (ref 11.5–14.5)
WBC: 6 x10 3/mm (ref 3.6–11.0)

## 2012-12-07 LAB — CBC CANCER CENTER
Basophil #: 0 x10 3/mm (ref 0.0–0.1)
Basophil %: 0.7 %
HCT: 31.6 % — ABNORMAL LOW (ref 35.0–47.0)
HGB: 10.6 g/dL — ABNORMAL LOW (ref 12.0–16.0)
MCH: 36.6 pg — ABNORMAL HIGH (ref 26.0–34.0)
MCHC: 33.7 g/dL (ref 32.0–36.0)
MCV: 109 fL — ABNORMAL HIGH (ref 80–100)
Monocyte #: 0.6 x10 3/mm (ref 0.2–0.9)
Neutrophil #: 4.4 x10 3/mm (ref 1.4–6.5)
Platelet: 454 x10 3/mm — ABNORMAL HIGH (ref 150–440)
RBC: 2.91 10*6/uL — ABNORMAL LOW (ref 3.80–5.20)

## 2012-12-15 LAB — CBC CANCER CENTER
Basophil %: 0.5 %
Eosinophil #: 0.1 x10 3/mm (ref 0.0–0.7)
Eosinophil %: 2.2 %
HCT: 29.8 % — ABNORMAL LOW (ref 35.0–47.0)
Lymphocyte #: 1.4 x10 3/mm (ref 1.0–3.6)
MCH: 38 pg — ABNORMAL HIGH (ref 26.0–34.0)
MCHC: 34.5 g/dL (ref 32.0–36.0)
Neutrophil #: 4.4 x10 3/mm (ref 1.4–6.5)
RBC: 2.71 10*6/uL — ABNORMAL LOW (ref 3.80–5.20)
RDW: 17.5 % — ABNORMAL HIGH (ref 11.5–14.5)
WBC: 6.4 x10 3/mm (ref 3.6–11.0)

## 2012-12-28 LAB — CANCER CENTER HEMOGLOBIN: HGB: 10.2 g/dL — ABNORMAL LOW (ref 12.0–16.0)

## 2012-12-29 ENCOUNTER — Ambulatory Visit: Payer: Self-pay | Admitting: Internal Medicine

## 2013-01-18 LAB — CBC CANCER CENTER
Basophil #: 0 x10 3/mm (ref 0.0–0.1)
Basophil %: 0.7 %
Eosinophil #: 0.1 x10 3/mm (ref 0.0–0.7)
Eosinophil %: 1.9 %
HCT: 30.3 % — ABNORMAL LOW (ref 35.0–47.0)
HGB: 10.3 g/dL — ABNORMAL LOW (ref 12.0–16.0)
Lymphocyte #: 1.4 x10 3/mm (ref 1.0–3.6)
MCH: 38.2 pg — ABNORMAL HIGH (ref 26.0–34.0)
MCHC: 34 g/dL (ref 32.0–36.0)
Neutrophil #: 4.3 x10 3/mm (ref 1.4–6.5)
Platelet: 458 x10 3/mm — ABNORMAL HIGH (ref 150–440)
RDW: 13.2 % (ref 11.5–14.5)

## 2013-01-18 LAB — IRON AND TIBC
Iron Bind.Cap.(Total): 309 ug/dL (ref 250–450)
Iron: 71 ug/dL (ref 50–170)

## 2013-01-29 ENCOUNTER — Ambulatory Visit: Payer: Self-pay | Admitting: Internal Medicine

## 2013-02-08 LAB — CBC CANCER CENTER
Basophil #: 0 x10 3/mm (ref 0.0–0.1)
Basophil %: 0.7 %
Eosinophil #: 0.1 x10 3/mm (ref 0.0–0.7)
HGB: 10.7 g/dL — ABNORMAL LOW (ref 12.0–16.0)
Lymphocyte #: 1.4 x10 3/mm (ref 1.0–3.6)
Lymphocyte %: 23.5 %
MCH: 38.2 pg — ABNORMAL HIGH (ref 26.0–34.0)
MCHC: 34.3 g/dL (ref 32.0–36.0)
Monocyte #: 0.5 x10 3/mm (ref 0.2–0.9)
Neutrophil %: 66 %
RDW: 13.3 % (ref 11.5–14.5)

## 2013-02-28 ENCOUNTER — Ambulatory Visit: Payer: Self-pay | Admitting: Internal Medicine

## 2013-03-09 LAB — CBC CANCER CENTER
Basophil %: 0.7 %
Eosinophil #: 0.1 x10 3/mm (ref 0.0–0.7)
Eosinophil %: 1.3 %
HCT: 31.4 % — ABNORMAL LOW (ref 35.0–47.0)
HGB: 10.9 g/dL — ABNORMAL LOW (ref 12.0–16.0)
Lymphocyte %: 23.7 %
MCH: 38 pg — ABNORMAL HIGH (ref 26.0–34.0)
MCHC: 34.8 g/dL (ref 32.0–36.0)
Monocyte #: 0.5 x10 3/mm (ref 0.2–0.9)
Monocyte %: 7.9 %
RBC: 2.88 10*6/uL — ABNORMAL LOW (ref 3.80–5.20)
RDW: 13 % (ref 11.5–14.5)

## 2013-03-31 ENCOUNTER — Ambulatory Visit: Payer: Self-pay | Admitting: Internal Medicine

## 2013-04-06 LAB — CBC CANCER CENTER
Basophil #: 0 x10 3/mm (ref 0.0–0.1)
Basophil %: 0.5 %
Eosinophil %: 1.6 %
HCT: 31.2 % — ABNORMAL LOW (ref 35.0–47.0)
Lymphocyte #: 1.7 x10 3/mm (ref 1.0–3.6)
Lymphocyte %: 24.2 %
MCH: 38.5 pg — ABNORMAL HIGH (ref 26.0–34.0)
MCHC: 35.4 g/dL (ref 32.0–36.0)
MCV: 109 fL — ABNORMAL HIGH (ref 80–100)
Monocyte #: 0.7 x10 3/mm (ref 0.2–0.9)
Neutrophil %: 64.2 %
Platelet: 596 x10 3/mm — ABNORMAL HIGH (ref 150–440)
RDW: 13 % (ref 11.5–14.5)
WBC: 7 x10 3/mm (ref 3.6–11.0)

## 2013-05-01 ENCOUNTER — Ambulatory Visit: Payer: Self-pay | Admitting: Internal Medicine

## 2013-05-01 ENCOUNTER — Ambulatory Visit: Payer: Self-pay | Admitting: Family Medicine

## 2013-05-04 LAB — CBC CANCER CENTER
Basophil #: 0 x10 3/mm (ref 0.0–0.1)
Eosinophil #: 0.1 x10 3/mm (ref 0.0–0.7)
Eosinophil %: 1.4 %
HCT: 31.9 % — ABNORMAL LOW (ref 35.0–47.0)
Lymphocyte %: 17.6 %
MCH: 36.8 pg — ABNORMAL HIGH (ref 26.0–34.0)
MCHC: 34.3 g/dL (ref 32.0–36.0)
RBC: 2.98 10*6/uL — ABNORMAL LOW (ref 3.80–5.20)
WBC: 8 x10 3/mm (ref 3.6–11.0)

## 2013-05-15 LAB — CBC CANCER CENTER
Basophil #: 0 x10 3/mm (ref 0.0–0.1)
Basophil %: 0.7 %
Eosinophil #: 0.1 x10 3/mm (ref 0.0–0.7)
HCT: 33 % — ABNORMAL LOW (ref 35.0–47.0)
Lymphocyte %: 25.7 %
MCH: 37.2 pg — ABNORMAL HIGH (ref 26.0–34.0)
MCHC: 34.3 g/dL (ref 32.0–36.0)
Monocyte %: 8.3 %
Neutrophil #: 4 x10 3/mm (ref 1.4–6.5)
Neutrophil %: 63.3 %
Platelet: 708 x10 3/mm — ABNORMAL HIGH (ref 150–440)
RBC: 3.04 10*6/uL — ABNORMAL LOW (ref 3.80–5.20)
RDW: 13.1 % (ref 11.5–14.5)
WBC: 6.4 x10 3/mm (ref 3.6–11.0)

## 2013-05-31 ENCOUNTER — Ambulatory Visit: Payer: Self-pay | Admitting: Internal Medicine

## 2013-06-01 LAB — CBC CANCER CENTER
Basophil #: 0 x10 3/mm (ref 0.0–0.1)
Eosinophil #: 0.1 x10 3/mm (ref 0.0–0.7)
Eosinophil %: 2.3 %
HGB: 10.4 g/dL — ABNORMAL LOW (ref 12.0–16.0)
Lymphocyte %: 27.7 %
MCH: 37.4 pg — ABNORMAL HIGH (ref 26.0–34.0)
MCHC: 34 g/dL (ref 32.0–36.0)
MCV: 110 fL — ABNORMAL HIGH (ref 80–100)
Monocyte %: 8.3 %
Neutrophil #: 3.5 x10 3/mm (ref 1.4–6.5)
RBC: 2.78 10*6/uL — ABNORMAL LOW (ref 3.80–5.20)
RDW: 14 % (ref 11.5–14.5)

## 2013-06-20 LAB — CBC CANCER CENTER
Basophil %: 0.8 %
Eosinophil %: 1.8 %
HGB: 10.7 g/dL — ABNORMAL LOW (ref 12.0–16.0)
Lymphocyte %: 24.9 %
MCH: 36.9 pg — ABNORMAL HIGH (ref 26.0–34.0)
MCHC: 33.8 g/dL (ref 32.0–36.0)
MCV: 109 fL — ABNORMAL HIGH (ref 80–100)
Monocyte #: 0.6 x10 3/mm (ref 0.2–0.9)
Monocyte %: 9.2 %
RDW: 14.2 % (ref 11.5–14.5)
WBC: 6.2 x10 3/mm (ref 3.6–11.0)

## 2013-06-24 ENCOUNTER — Other Ambulatory Visit (HOSPITAL_COMMUNITY): Payer: Self-pay | Admitting: Internal Medicine

## 2013-07-01 ENCOUNTER — Ambulatory Visit: Payer: Self-pay | Admitting: Internal Medicine

## 2013-07-11 LAB — CBC CANCER CENTER
Basophil #: 0 x10 3/mm (ref 0.0–0.1)
Basophil %: 0.8 %
Eosinophil #: 0.2 x10 3/mm (ref 0.0–0.7)
Eosinophil %: 2.6 %
Lymphocyte #: 0.9 x10 3/mm — ABNORMAL LOW (ref 1.0–3.6)
MCH: 36.6 pg — ABNORMAL HIGH (ref 26.0–34.0)
MCHC: 33.4 g/dL (ref 32.0–36.0)
MCV: 110 fL — ABNORMAL HIGH (ref 80–100)
Monocyte #: 0.4 x10 3/mm (ref 0.2–0.9)
Neutrophil #: 4.2 x10 3/mm (ref 1.4–6.5)
Platelet: 593 x10 3/mm — ABNORMAL HIGH (ref 150–440)
RBC: 2.97 10*6/uL — ABNORMAL LOW (ref 3.80–5.20)
RDW: 14.3 % (ref 11.5–14.5)

## 2013-07-31 ENCOUNTER — Ambulatory Visit: Payer: Self-pay | Admitting: Internal Medicine

## 2013-08-10 LAB — CBC CANCER CENTER
Basophil #: 0 x10 3/mm (ref 0.0–0.1)
Basophil %: 0.6 %
Eosinophil #: 0.2 x10 3/mm (ref 0.0–0.7)
Eosinophil %: 3.2 %
HCT: 31 % — ABNORMAL LOW (ref 35.0–47.0)
Lymphocyte #: 1.2 x10 3/mm (ref 1.0–3.6)
MCH: 37.7 pg — ABNORMAL HIGH (ref 26.0–34.0)
Neutrophil #: 3.5 x10 3/mm (ref 1.4–6.5)
Neutrophil %: 66.7 %
RDW: 15.1 % — ABNORMAL HIGH (ref 11.5–14.5)
WBC: 5.2 x10 3/mm (ref 3.6–11.0)

## 2013-08-31 ENCOUNTER — Ambulatory Visit: Payer: Self-pay | Admitting: Internal Medicine

## 2013-09-05 LAB — CBC CANCER CENTER
Basophil #: 0 x10 3/mm (ref 0.0–0.1)
Basophil %: 0.5 %
EOS ABS: 0.1 x10 3/mm (ref 0.0–0.7)
EOS PCT: 2.3 %
HCT: 31.6 % — ABNORMAL LOW (ref 35.0–47.0)
HGB: 10.5 g/dL — ABNORMAL LOW (ref 12.0–16.0)
LYMPHS ABS: 1.1 x10 3/mm (ref 1.0–3.6)
LYMPHS PCT: 20.9 %
MCH: 38.1 pg — ABNORMAL HIGH (ref 26.0–34.0)
MCHC: 33.4 g/dL (ref 32.0–36.0)
MCV: 114 fL — AB (ref 80–100)
MONO ABS: 0.5 x10 3/mm (ref 0.2–0.9)
Monocyte %: 9.4 %
Neutrophil #: 3.5 x10 3/mm (ref 1.4–6.5)
Neutrophil %: 66.9 %
PLATELETS: 444 x10 3/mm — AB (ref 150–440)
RBC: 2.76 10*6/uL — ABNORMAL LOW (ref 3.80–5.20)
RDW: 15 % — ABNORMAL HIGH (ref 11.5–14.5)
WBC: 5.3 x10 3/mm (ref 3.6–11.0)

## 2013-10-01 ENCOUNTER — Ambulatory Visit: Payer: Self-pay | Admitting: Internal Medicine

## 2013-10-04 LAB — CBC CANCER CENTER
BASOS ABS: 0 x10 3/mm (ref 0.0–0.1)
Basophil %: 0.6 %
Eosinophil #: 0.1 x10 3/mm (ref 0.0–0.7)
Eosinophil %: 1.6 %
HCT: 30.8 % — AB (ref 35.0–47.0)
HGB: 10.4 g/dL — AB (ref 12.0–16.0)
Lymphocyte #: 1 x10 3/mm (ref 1.0–3.6)
Lymphocyte %: 22.5 %
MCH: 39 pg — AB (ref 26.0–34.0)
MCHC: 33.7 g/dL (ref 32.0–36.0)
MCV: 116 fL — ABNORMAL HIGH (ref 80–100)
Monocyte #: 0.4 x10 3/mm (ref 0.2–0.9)
Monocyte %: 7.9 %
Neutrophil #: 3.1 x10 3/mm (ref 1.4–6.5)
Neutrophil %: 67.4 %
PLATELETS: 452 x10 3/mm — AB (ref 150–440)
RBC: 2.67 10*6/uL — ABNORMAL LOW (ref 3.80–5.20)
RDW: 14.4 % (ref 11.5–14.5)
WBC: 4.6 x10 3/mm (ref 3.6–11.0)

## 2013-10-20 LAB — CBC CANCER CENTER
Basophil #: 0 x10 3/mm (ref 0.0–0.1)
Basophil %: 0.5 %
EOS ABS: 0.1 x10 3/mm (ref 0.0–0.7)
Eosinophil %: 1.7 %
HCT: 29.5 % — AB (ref 35.0–47.0)
HGB: 9.9 g/dL — ABNORMAL LOW (ref 12.0–16.0)
LYMPHS ABS: 1 x10 3/mm (ref 1.0–3.6)
LYMPHS PCT: 22.7 %
MCH: 39.4 pg — AB (ref 26.0–34.0)
MCHC: 33.6 g/dL (ref 32.0–36.0)
MCV: 117 fL — ABNORMAL HIGH (ref 80–100)
MONO ABS: 0.4 x10 3/mm (ref 0.2–0.9)
Monocyte %: 8.5 %
Neutrophil #: 2.9 x10 3/mm (ref 1.4–6.5)
Neutrophil %: 66.6 %
PLATELETS: 391 x10 3/mm (ref 150–440)
RBC: 2.51 10*6/uL — AB (ref 3.80–5.20)
RDW: 14.9 % — ABNORMAL HIGH (ref 11.5–14.5)
WBC: 4.3 x10 3/mm (ref 3.6–11.0)

## 2013-10-29 ENCOUNTER — Ambulatory Visit: Payer: Self-pay | Admitting: Internal Medicine

## 2013-11-01 LAB — CBC CANCER CENTER
BASOS ABS: 0 x10 3/mm (ref 0.0–0.1)
BASOS PCT: 0.4 %
EOS PCT: 1.3 %
Eosinophil #: 0.1 x10 3/mm (ref 0.0–0.7)
HCT: 29.3 % — AB (ref 35.0–47.0)
HGB: 9.7 g/dL — ABNORMAL LOW (ref 12.0–16.0)
LYMPHS ABS: 1.3 x10 3/mm (ref 1.0–3.6)
LYMPHS PCT: 27.2 %
MCH: 38.8 pg — ABNORMAL HIGH (ref 26.0–34.0)
MCHC: 33.1 g/dL (ref 32.0–36.0)
MCV: 117 fL — ABNORMAL HIGH (ref 80–100)
Monocyte #: 0.3 x10 3/mm (ref 0.2–0.9)
Monocyte %: 7.1 %
NEUTROS ABS: 3.1 x10 3/mm (ref 1.4–6.5)
Neutrophil %: 64 %
Platelet: 446 x10 3/mm — ABNORMAL HIGH (ref 150–440)
RBC: 2.51 10*6/uL — ABNORMAL LOW (ref 3.80–5.20)
RDW: 14.6 % — AB (ref 11.5–14.5)
WBC: 4.8 x10 3/mm (ref 3.6–11.0)

## 2013-11-01 LAB — FERRITIN: FERRITIN (ARMC): 70 ng/mL (ref 8–388)

## 2013-11-01 LAB — IRON AND TIBC
IRON BIND. CAP.(TOTAL): 289 ug/dL (ref 250–450)
IRON SATURATION: 26 %
IRON: 74 ug/dL (ref 50–170)
Unbound Iron-Bind.Cap.: 215 ug/dL

## 2013-11-29 ENCOUNTER — Ambulatory Visit: Payer: Self-pay | Admitting: Internal Medicine

## 2013-11-30 LAB — CBC CANCER CENTER
Basophil #: 0 x10 3/mm (ref 0.0–0.1)
Basophil %: 0.6 %
EOS PCT: 1.2 %
Eosinophil #: 0.1 x10 3/mm (ref 0.0–0.7)
HCT: 31 % — AB (ref 35.0–47.0)
HGB: 10.4 g/dL — ABNORMAL LOW (ref 12.0–16.0)
LYMPHS PCT: 22.1 %
Lymphocyte #: 0.9 x10 3/mm — ABNORMAL LOW (ref 1.0–3.6)
MCH: 39.8 pg — AB (ref 26.0–34.0)
MCHC: 33.4 g/dL (ref 32.0–36.0)
MCV: 119 fL — ABNORMAL HIGH (ref 80–100)
MONOS PCT: 10.2 %
Monocyte #: 0.4 x10 3/mm (ref 0.2–0.9)
Neutrophil #: 2.8 x10 3/mm (ref 1.4–6.5)
Neutrophil %: 65.9 %
Platelet: 400 x10 3/mm (ref 150–440)
RBC: 2.6 10*6/uL — ABNORMAL LOW (ref 3.80–5.20)
RDW: 13.3 % (ref 11.5–14.5)
WBC: 4.2 x10 3/mm (ref 3.6–11.0)

## 2013-12-29 ENCOUNTER — Ambulatory Visit: Payer: Self-pay | Admitting: Internal Medicine

## 2014-01-03 LAB — CBC CANCER CENTER
Basophil #: 0 x10 3/mm (ref 0.0–0.1)
Basophil %: 0.6 %
Eosinophil #: 0.1 x10 3/mm (ref 0.0–0.7)
Eosinophil %: 1.9 %
HCT: 29.8 % — AB (ref 35.0–47.0)
HGB: 10 g/dL — ABNORMAL LOW (ref 12.0–16.0)
LYMPHS PCT: 20.6 %
Lymphocyte #: 0.8 x10 3/mm — ABNORMAL LOW (ref 1.0–3.6)
MCH: 40.1 pg — AB (ref 26.0–34.0)
MCHC: 33.5 g/dL (ref 32.0–36.0)
MCV: 120 fL — ABNORMAL HIGH (ref 80–100)
Monocyte #: 0.3 x10 3/mm (ref 0.2–0.9)
Monocyte %: 7 %
NEUTROS ABS: 2.8 x10 3/mm (ref 1.4–6.5)
Neutrophil %: 69.9 %
Platelet: 358 x10 3/mm (ref 150–440)
RBC: 2.49 10*6/uL — ABNORMAL LOW (ref 3.80–5.20)
RDW: 13.9 % (ref 11.5–14.5)
WBC: 3.9 x10 3/mm (ref 3.6–11.0)

## 2014-01-23 ENCOUNTER — Other Ambulatory Visit (HOSPITAL_COMMUNITY): Payer: Self-pay | Admitting: Internal Medicine

## 2014-01-29 ENCOUNTER — Ambulatory Visit: Payer: Self-pay | Admitting: Internal Medicine

## 2014-02-06 LAB — CBC CANCER CENTER
BASOS ABS: 0 x10 3/mm (ref 0.0–0.1)
Basophil %: 0.3 %
EOS ABS: 0.1 x10 3/mm (ref 0.0–0.7)
EOS PCT: 1.1 %
HCT: 31.5 % — AB (ref 35.0–47.0)
HGB: 10.6 g/dL — ABNORMAL LOW (ref 12.0–16.0)
LYMPHS PCT: 21.9 %
Lymphocyte #: 1 x10 3/mm (ref 1.0–3.6)
MCH: 40.6 pg — AB (ref 26.0–34.0)
MCHC: 33.7 g/dL (ref 32.0–36.0)
MCV: 121 fL — ABNORMAL HIGH (ref 80–100)
MONOS PCT: 6.5 %
Monocyte #: 0.3 x10 3/mm (ref 0.2–0.9)
Neutrophil #: 3.3 x10 3/mm (ref 1.4–6.5)
Neutrophil %: 70.2 %
Platelet: 336 x10 3/mm (ref 150–440)
RBC: 2.61 10*6/uL — AB (ref 3.80–5.20)
RDW: 13.4 % (ref 11.5–14.5)
WBC: 4.8 x10 3/mm (ref 3.6–11.0)

## 2014-02-28 ENCOUNTER — Ambulatory Visit: Payer: Self-pay | Admitting: Internal Medicine

## 2014-03-07 LAB — CBC CANCER CENTER
BASOS ABS: 0 x10 3/mm (ref 0.0–0.1)
BASOS PCT: 0.6 %
EOS PCT: 1.6 %
Eosinophil #: 0.1 x10 3/mm (ref 0.0–0.7)
HCT: 30.8 % — ABNORMAL LOW (ref 35.0–47.0)
HGB: 10.4 g/dL — AB (ref 12.0–16.0)
Lymphocyte #: 1.1 x10 3/mm (ref 1.0–3.6)
Lymphocyte %: 30.1 %
MCH: 40.9 pg — AB (ref 26.0–34.0)
MCHC: 33.8 g/dL (ref 32.0–36.0)
MCV: 121 fL — AB (ref 80–100)
Monocyte #: 0.3 x10 3/mm (ref 0.2–0.9)
Monocyte %: 8.2 %
Neutrophil #: 2.2 x10 3/mm (ref 1.4–6.5)
Neutrophil %: 59.5 %
PLATELETS: 312 x10 3/mm (ref 150–440)
RBC: 2.55 10*6/uL — ABNORMAL LOW (ref 3.80–5.20)
RDW: 13.7 % (ref 11.5–14.5)
WBC: 3.7 x10 3/mm (ref 3.6–11.0)

## 2014-03-31 ENCOUNTER — Ambulatory Visit: Payer: Self-pay | Admitting: Internal Medicine

## 2014-04-04 LAB — CBC CANCER CENTER
BASOS ABS: 0 x10 3/mm (ref 0.0–0.1)
Basophil %: 0.5 %
Eosinophil #: 0.1 x10 3/mm (ref 0.0–0.7)
Eosinophil %: 1.7 %
HCT: 29.4 % — ABNORMAL LOW (ref 35.0–47.0)
HGB: 9.9 g/dL — ABNORMAL LOW (ref 12.0–16.0)
LYMPHS ABS: 1.2 x10 3/mm (ref 1.0–3.6)
Lymphocyte %: 30.1 %
MCH: 41.1 pg — ABNORMAL HIGH (ref 26.0–34.0)
MCHC: 33.7 g/dL (ref 32.0–36.0)
MCV: 122 fL — ABNORMAL HIGH (ref 80–100)
Monocyte #: 0.3 x10 3/mm (ref 0.2–0.9)
Monocyte %: 8.3 %
NEUTROS PCT: 59.4 %
Neutrophil #: 2.3 x10 3/mm (ref 1.4–6.5)
PLATELETS: 324 x10 3/mm (ref 150–440)
RBC: 2.41 10*6/uL — ABNORMAL LOW (ref 3.80–5.20)
RDW: 13.2 % (ref 11.5–14.5)
WBC: 3.9 x10 3/mm (ref 3.6–11.0)

## 2014-04-13 ENCOUNTER — Emergency Department: Payer: Self-pay | Admitting: Emergency Medicine

## 2014-04-13 LAB — COMPREHENSIVE METABOLIC PANEL
ALK PHOS: 70 U/L
ALT: 17 U/L
AST: 26 U/L (ref 15–37)
Albumin: 3.7 g/dL (ref 3.4–5.0)
Anion Gap: 11 (ref 7–16)
BILIRUBIN TOTAL: 0.3 mg/dL (ref 0.2–1.0)
BUN: 30 mg/dL — AB (ref 7–18)
CHLORIDE: 108 mmol/L — AB (ref 98–107)
Calcium, Total: 9 mg/dL (ref 8.5–10.1)
Co2: 25 mmol/L (ref 21–32)
Creatinine: 1.55 mg/dL — ABNORMAL HIGH (ref 0.60–1.30)
GFR CALC AF AMER: 33 — AB
GFR CALC NON AF AMER: 29 — AB
GLUCOSE: 121 mg/dL — AB (ref 65–99)
OSMOLALITY: 294 (ref 275–301)
Potassium: 4.3 mmol/L (ref 3.5–5.1)
Sodium: 144 mmol/L (ref 136–145)
TOTAL PROTEIN: 7.2 g/dL (ref 6.4–8.2)

## 2014-04-13 LAB — CBC
HCT: 31.4 % — ABNORMAL LOW (ref 35.0–47.0)
HGB: 10.4 g/dL — ABNORMAL LOW (ref 12.0–16.0)
MCH: 40.8 pg — ABNORMAL HIGH (ref 26.0–34.0)
MCHC: 33.2 g/dL (ref 32.0–36.0)
MCV: 123 fL — AB (ref 80–100)
PLATELETS: 397 10*3/uL (ref 150–440)
RBC: 2.56 10*6/uL — AB (ref 3.80–5.20)
RDW: 13.1 % (ref 11.5–14.5)
WBC: 4.1 10*3/uL (ref 3.6–11.0)

## 2014-04-13 LAB — TROPONIN I
Troponin-I: <0.02 ng/mL
Troponin-I: <0.02 ng/mL

## 2014-05-01 ENCOUNTER — Ambulatory Visit: Payer: Self-pay | Admitting: Internal Medicine

## 2014-05-09 LAB — CBC CANCER CENTER
BASOS PCT: 0.6 %
Basophil #: 0 x10 3/mm (ref 0.0–0.1)
EOS PCT: 1.3 %
Eosinophil #: 0.1 x10 3/mm (ref 0.0–0.7)
HCT: 29.7 % — AB (ref 35.0–47.0)
HGB: 10.1 g/dL — ABNORMAL LOW (ref 12.0–16.0)
LYMPHS ABS: 1 x10 3/mm (ref 1.0–3.6)
LYMPHS PCT: 19.4 %
MCH: 41 pg — ABNORMAL HIGH (ref 26.0–34.0)
MCHC: 33.9 g/dL (ref 32.0–36.0)
MCV: 121 fL — ABNORMAL HIGH (ref 80–100)
MONOS PCT: 7.9 %
Monocyte #: 0.4 x10 3/mm (ref 0.2–0.9)
NEUTROS ABS: 3.7 x10 3/mm (ref 1.4–6.5)
NEUTROS PCT: 70.8 %
Platelet: 360 x10 3/mm (ref 150–440)
RBC: 2.45 10*6/uL — ABNORMAL LOW (ref 3.80–5.20)
RDW: 12.7 % (ref 11.5–14.5)
WBC: 5.3 x10 3/mm (ref 3.6–11.0)

## 2014-06-03 ENCOUNTER — Emergency Department: Payer: Self-pay | Admitting: Emergency Medicine

## 2014-06-03 LAB — BASIC METABOLIC PANEL
Anion Gap: 7 (ref 7–16)
BUN: 23 mg/dL — AB (ref 7–18)
CALCIUM: 9.2 mg/dL (ref 8.5–10.1)
Chloride: 107 mmol/L (ref 98–107)
Co2: 27 mmol/L (ref 21–32)
Creatinine: 1.32 mg/dL — ABNORMAL HIGH (ref 0.60–1.30)
EGFR (Non-African Amer.): 40 — ABNORMAL LOW
GFR CALC AF AMER: 48 — AB
GLUCOSE: 120 mg/dL — AB (ref 65–99)
Osmolality: 286 (ref 275–301)
Potassium: 3.6 mmol/L (ref 3.5–5.1)
SODIUM: 141 mmol/L (ref 136–145)

## 2014-06-03 LAB — CBC WITH DIFFERENTIAL/PLATELET
BASOS PCT: 0.6 %
Basophil #: 0 10*3/uL (ref 0.0–0.1)
Eosinophil #: 0.1 10*3/uL (ref 0.0–0.7)
Eosinophil %: 1.6 %
HCT: 31.4 % — ABNORMAL LOW (ref 35.0–47.0)
HGB: 10.4 g/dL — ABNORMAL LOW (ref 12.0–16.0)
Lymphocyte #: 1.5 10*3/uL (ref 1.0–3.6)
Lymphocyte %: 23.8 %
MCH: 40 pg — AB (ref 26.0–34.0)
MCHC: 33.2 g/dL (ref 32.0–36.0)
MCV: 120 fL — AB (ref 80–100)
Monocyte #: 0.6 x10 3/mm (ref 0.2–0.9)
Monocyte %: 8.6 %
NEUTROS ABS: 4.2 10*3/uL (ref 1.4–6.5)
Neutrophil %: 65.4 %
Platelet: 447 10*3/uL — ABNORMAL HIGH (ref 150–440)
RBC: 2.61 10*6/uL — ABNORMAL LOW (ref 3.80–5.20)
RDW: 12.7 % (ref 11.5–14.5)
WBC: 6.4 10*3/uL (ref 3.6–11.0)

## 2014-06-03 LAB — PROTIME-INR
INR: 1
Prothrombin Time: 13.1 secs (ref 11.5–14.7)

## 2014-06-03 LAB — APTT: Activated PTT: 35.1 secs (ref 23.6–35.9)

## 2014-06-03 LAB — TROPONIN I

## 2014-06-04 ENCOUNTER — Ambulatory Visit: Payer: Self-pay | Admitting: Internal Medicine

## 2014-07-01 ENCOUNTER — Ambulatory Visit: Payer: Self-pay | Admitting: Internal Medicine

## 2014-07-18 LAB — CBC CANCER CENTER
Basophil #: 0 x10 3/mm (ref 0.0–0.1)
Basophil %: 0.7 %
EOS ABS: 0.1 x10 3/mm (ref 0.0–0.7)
Eosinophil %: 1.8 %
HCT: 31.8 % — ABNORMAL LOW (ref 35.0–47.0)
HGB: 10.6 g/dL — ABNORMAL LOW (ref 12.0–16.0)
LYMPHS ABS: 1.2 x10 3/mm (ref 1.0–3.6)
LYMPHS PCT: 25.5 %
MCH: 38.9 pg — AB (ref 26.0–34.0)
MCHC: 33.2 g/dL (ref 32.0–36.0)
MCV: 117 fL — ABNORMAL HIGH (ref 80–100)
Monocyte #: 0.3 x10 3/mm (ref 0.2–0.9)
Monocyte %: 5.8 %
Neutrophil #: 3.1 x10 3/mm (ref 1.4–6.5)
Neutrophil %: 66.2 %
PLATELETS: 453 x10 3/mm — AB (ref 150–440)
RBC: 2.72 10*6/uL — ABNORMAL LOW (ref 3.80–5.20)
RDW: 12.7 % (ref 11.5–14.5)
WBC: 4.6 x10 3/mm (ref 3.6–11.0)

## 2014-07-31 ENCOUNTER — Ambulatory Visit: Payer: Self-pay | Admitting: Internal Medicine

## 2014-09-12 ENCOUNTER — Ambulatory Visit: Payer: Self-pay | Admitting: Internal Medicine

## 2014-09-12 LAB — CBC CANCER CENTER
BASOS PCT: 0.6 %
Basophil #: 0 x10 3/mm (ref 0.0–0.1)
EOS PCT: 1.1 %
Eosinophil #: 0 x10 3/mm (ref 0.0–0.7)
HCT: 31 % — AB (ref 35.0–47.0)
HGB: 10.2 g/dL — ABNORMAL LOW (ref 12.0–16.0)
Lymphocyte #: 1.3 x10 3/mm (ref 1.0–3.6)
Lymphocyte %: 28.7 %
MCH: 39.3 pg — ABNORMAL HIGH (ref 26.0–34.0)
MCHC: 33 g/dL (ref 32.0–36.0)
MCV: 119 fL — ABNORMAL HIGH (ref 80–100)
Monocyte #: 0.4 x10 3/mm (ref 0.2–0.9)
Monocyte %: 7.9 %
Neutrophil #: 2.8 x10 3/mm (ref 1.4–6.5)
Neutrophil %: 61.7 %
PLATELETS: 376 x10 3/mm (ref 150–440)
RBC: 2.6 10*6/uL — AB (ref 3.80–5.20)
RDW: 14.2 % (ref 11.5–14.5)
WBC: 4.5 x10 3/mm (ref 3.6–11.0)

## 2014-10-01 ENCOUNTER — Ambulatory Visit: Payer: Self-pay | Admitting: Internal Medicine

## 2014-11-06 IMAGING — CR DG CHEST 2V
1 series · 3 of 3 positions shown · non-contrast
Comparison: none

REASON FOR EXAM: chest pain
COMMENTS:

[Series 3: x chest ap · 0.14mm/px · 3 of 3 slices shown]
[im 1/3]
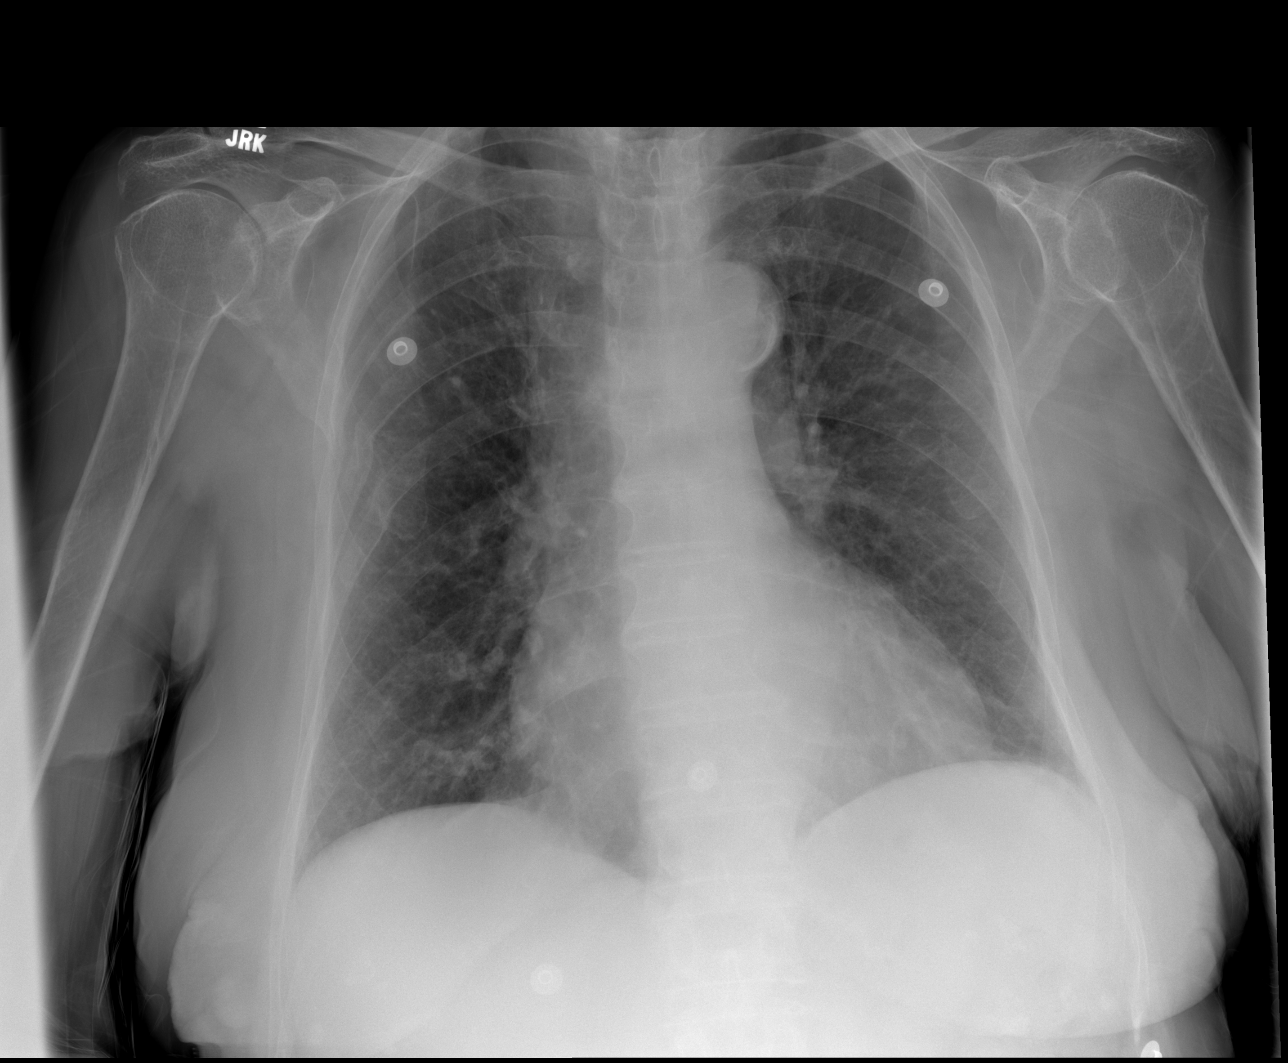
[im 2/3]
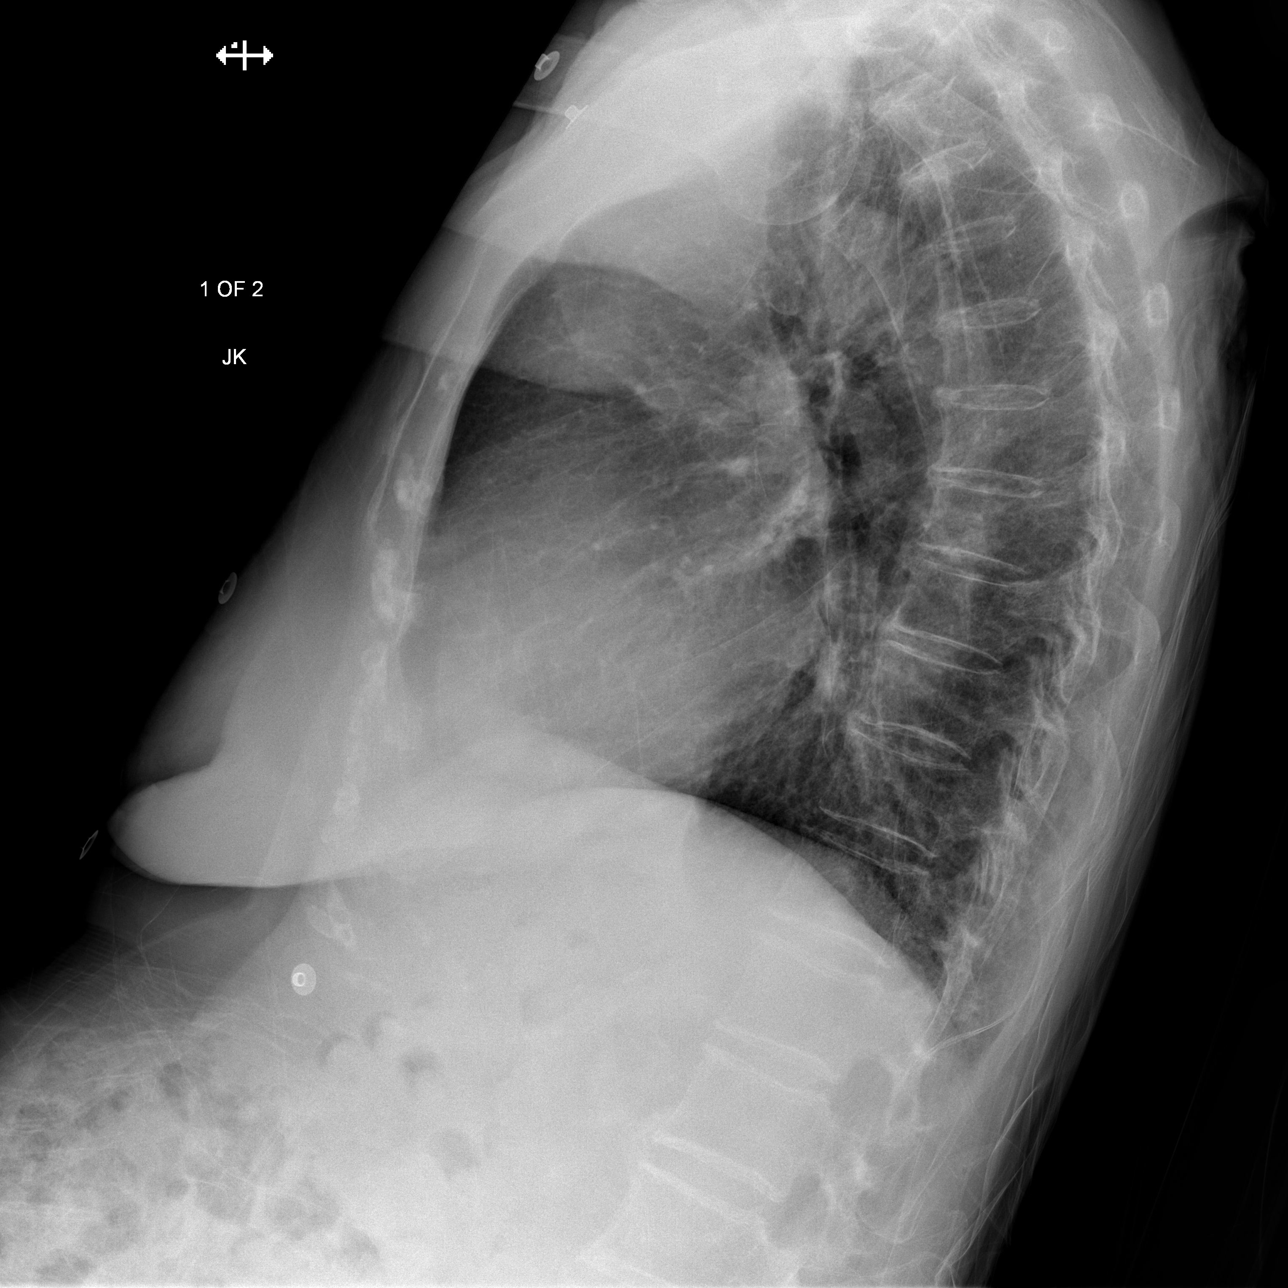
[im 3/3]
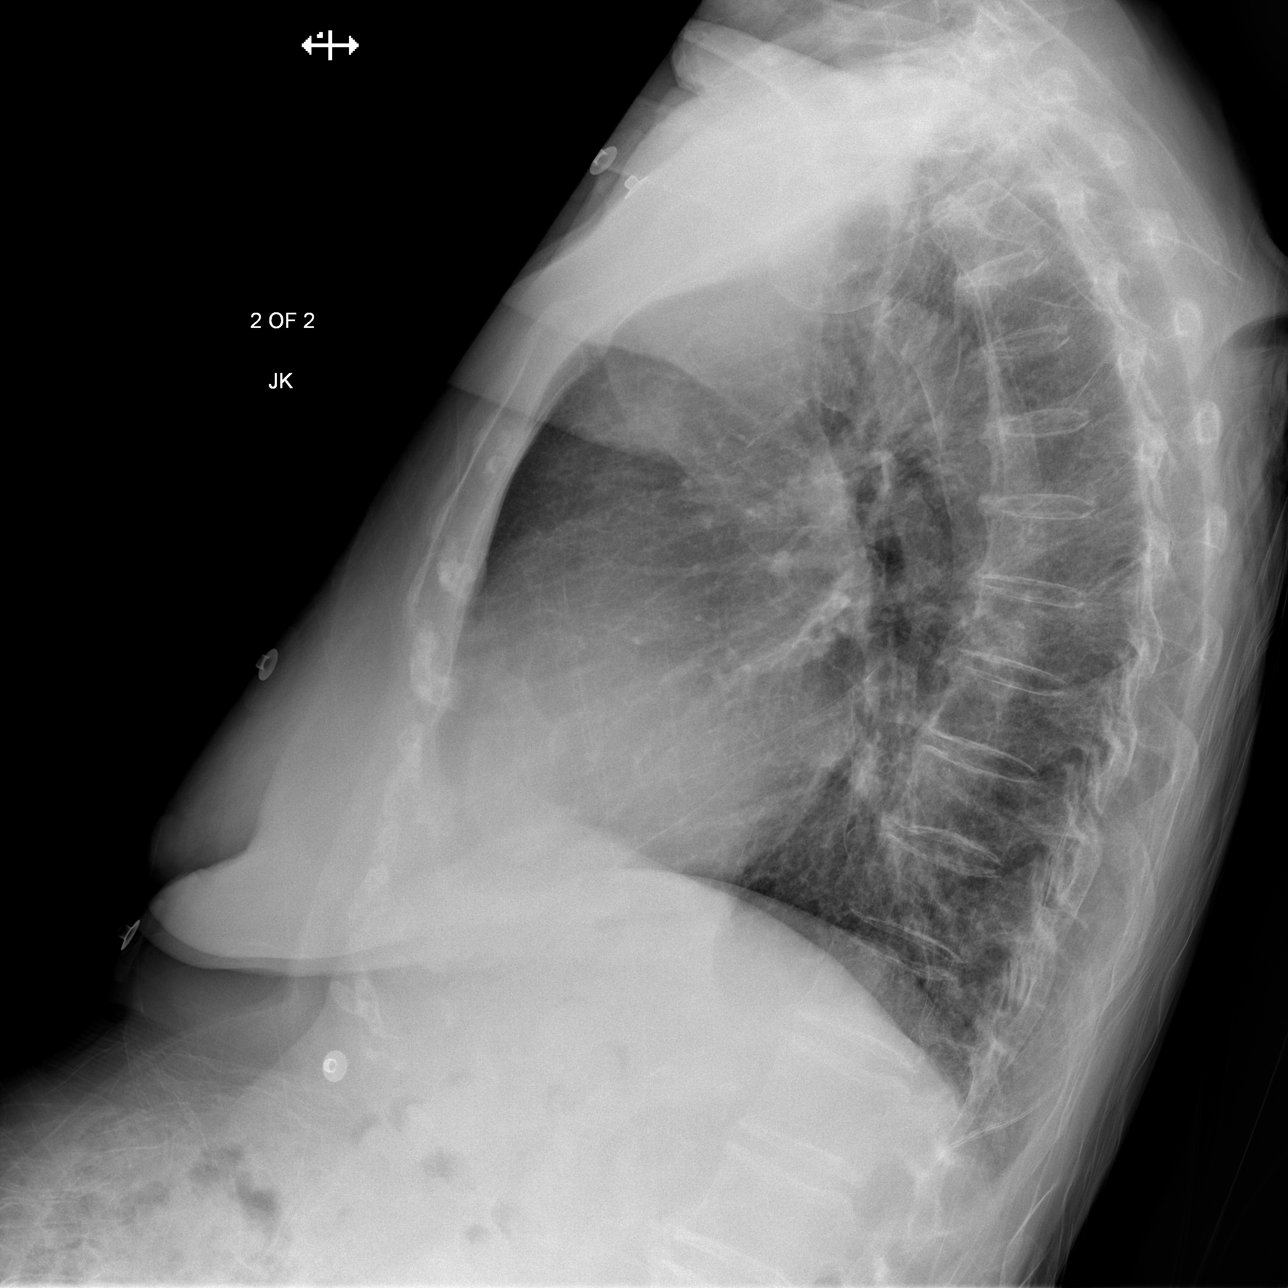

[3 of 3 positions shown; findings below may reference images not displayed]

PROCEDURE:     DXR - DXR CHEST PA (OR AP) AND LATERAL  - September 06, 2012  [DATE]

RESULT:     Comparison is made to the study 28 August, 2012.
Atherosclerotic calcification is present within the aortic arch. There is
hyperinflation consistent with COPD. Osteopenia is present. There is no
edema, infiltrate, effusion, mass or pneumothorax demonstrated. Or old
healed posterior lateral right rib fractures from the fifth to the eighth
ribs.
IMPRESSION: 1. No acute cardiopulmonary disease. Osteopenia. Atherosclerotic
calcification present. Old healed posterior lateral right rib fractures.

[REDACTED]

## 2014-11-07 ENCOUNTER — Ambulatory Visit
Admit: 2014-11-07 | Disposition: A | Discharge status: Home / Self Care | Payer: Self-pay | Attending: Internal Medicine | Admitting: Internal Medicine

## 2014-11-14 IMAGING — CR DG CHEST 2V
1 series · 2 of 2 positions shown · non-contrast
Comparison: none

REASON FOR EXAM: SOB
COMMENTS:

PROCEDURE:     DXR - DXR CHEST PA (OR AP) AND LATERAL  - September 14, 2012  [DATE]
RESULT:     Comparison: 09/06/2012

[Series 6: x chest ap · 0.14mm/px · 2 of 2 slices shown]
[im 1/2]
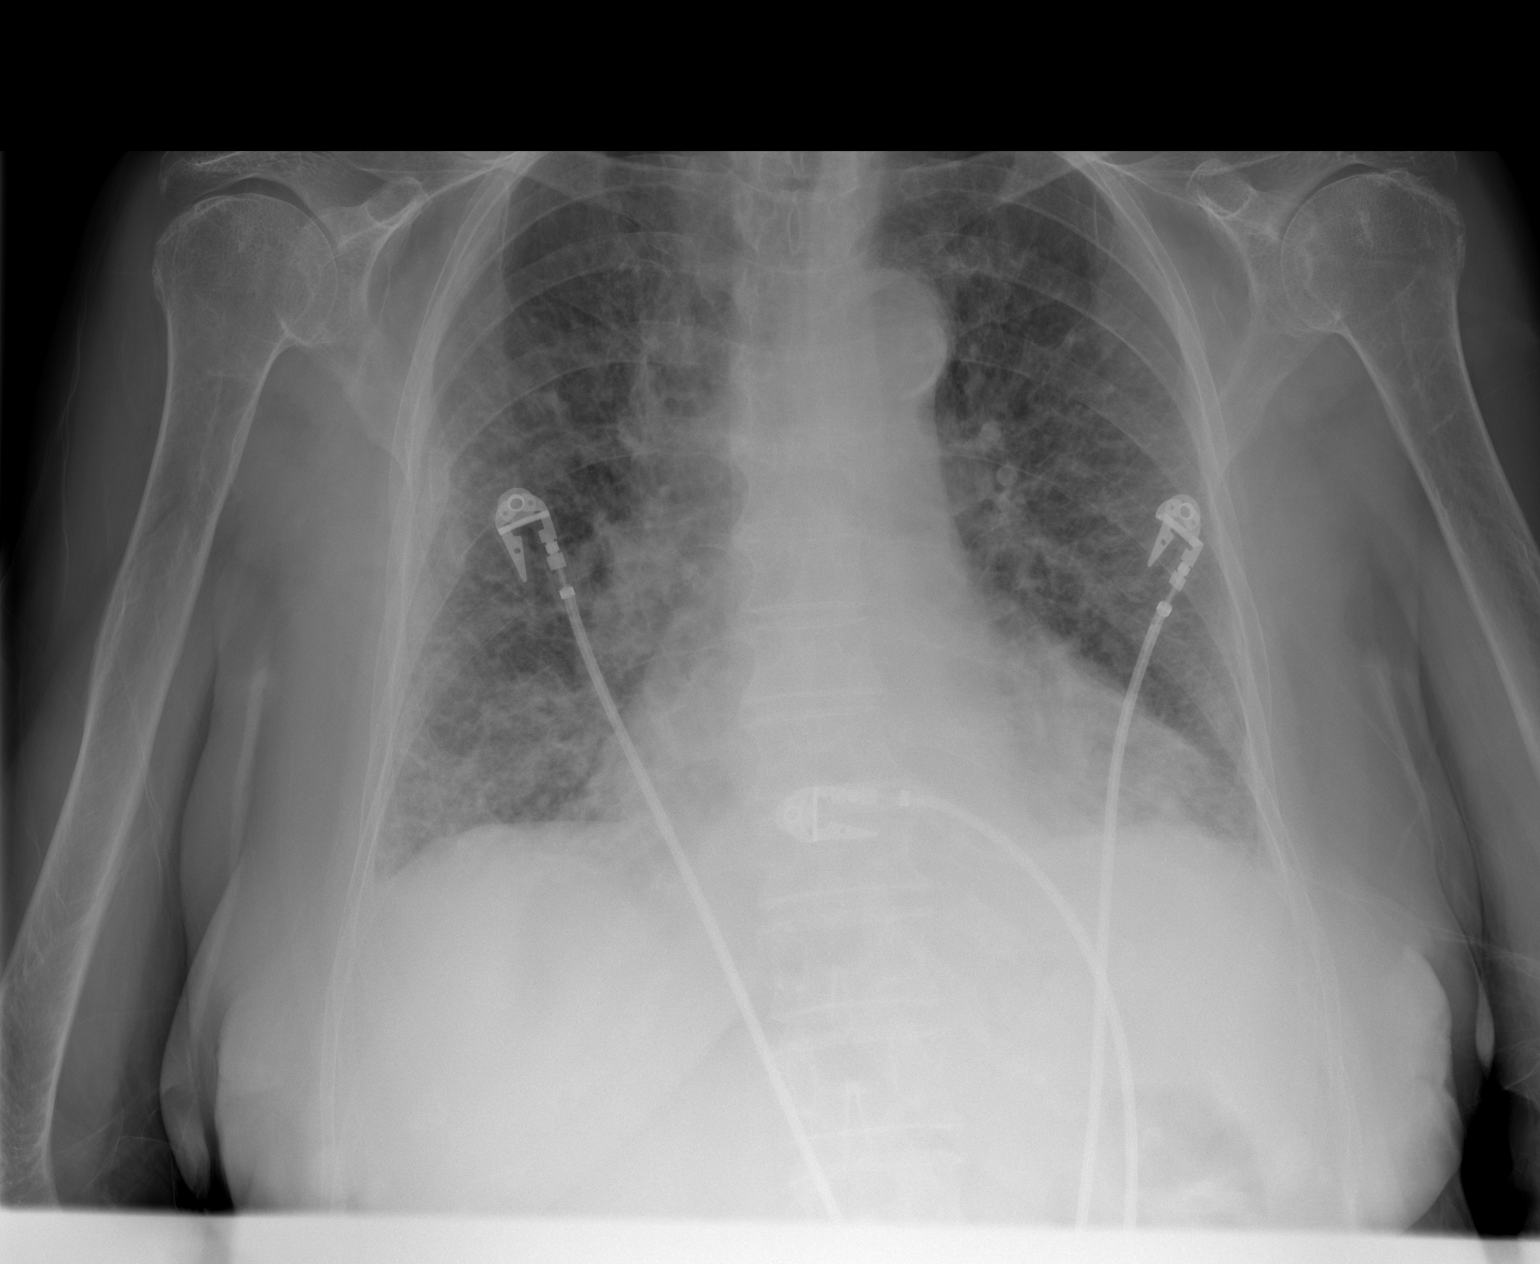
[im 2/2]
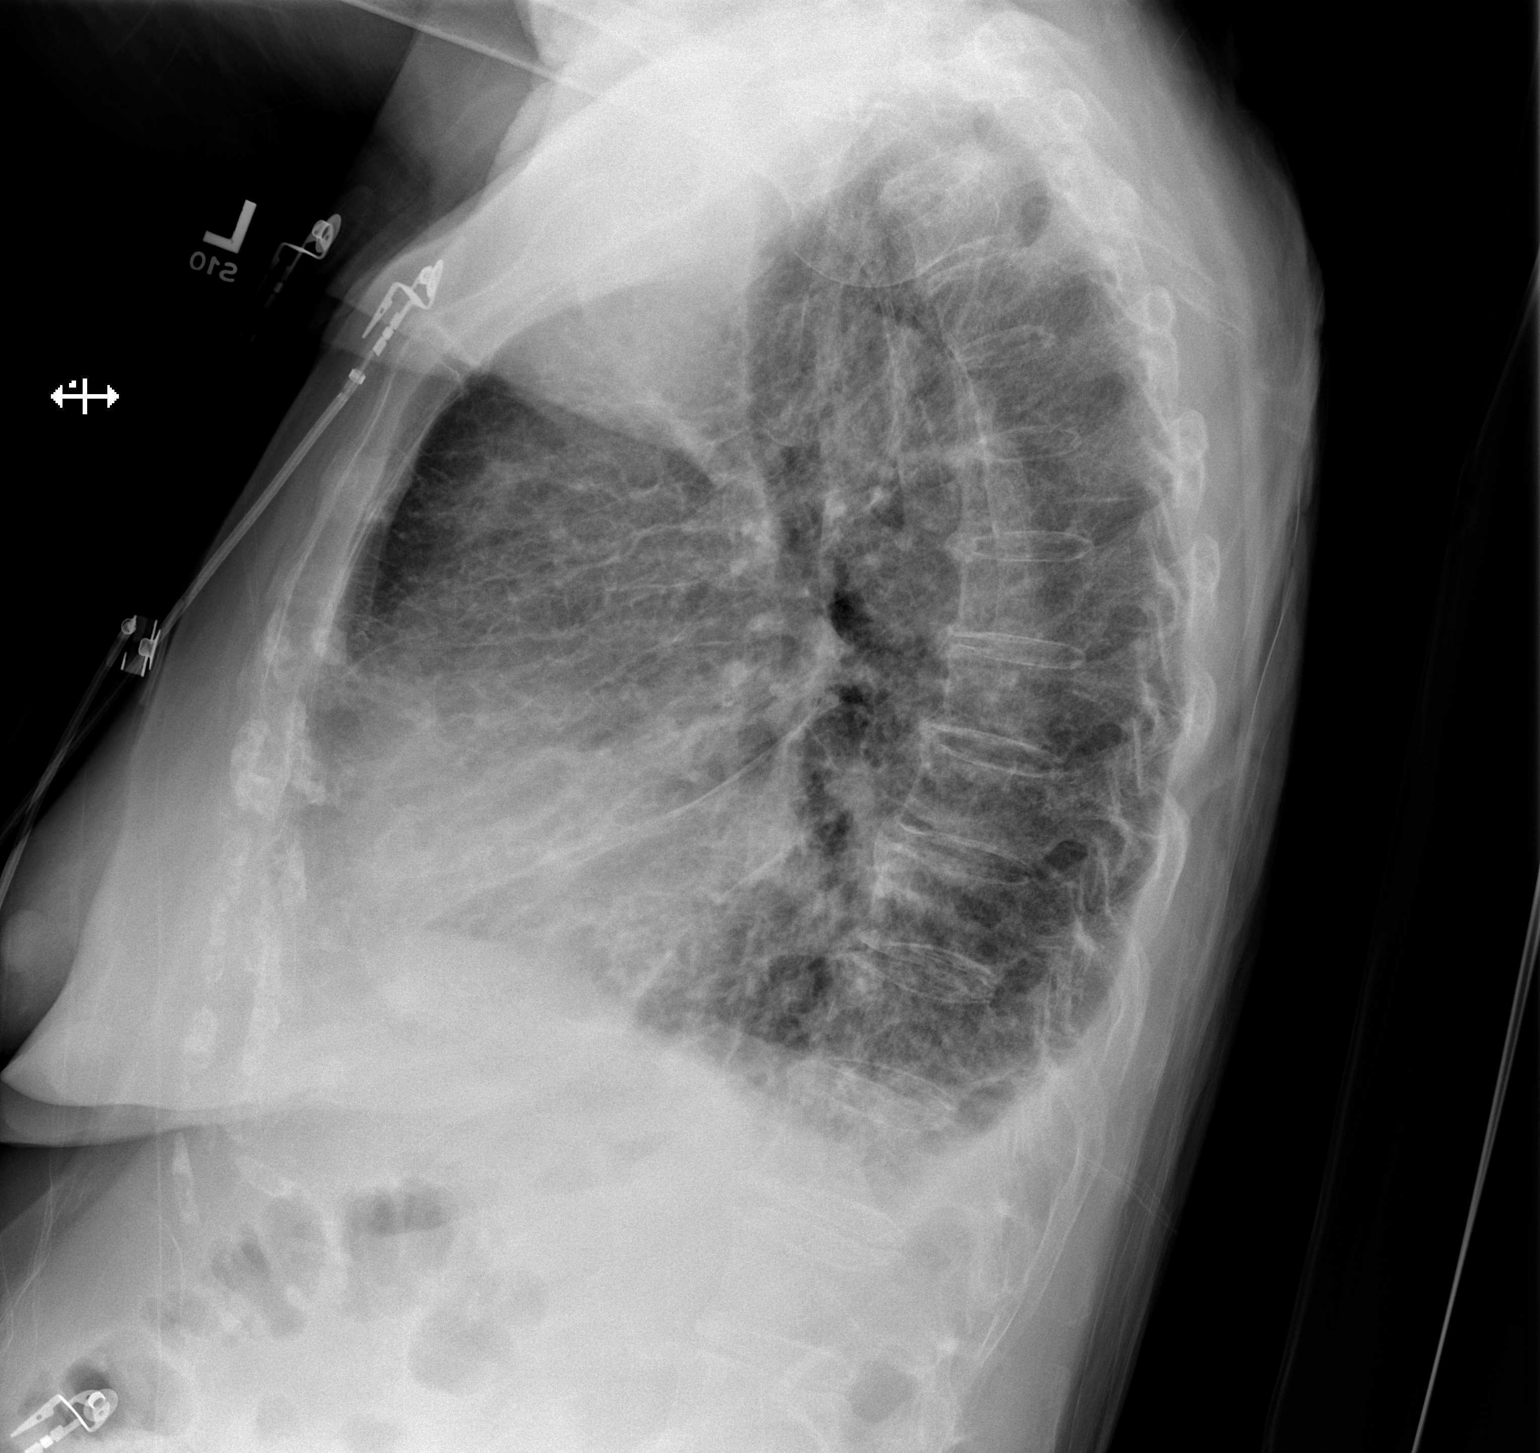

[2 of 2 positions shown; findings below may reference images not displayed]

FINDINGS: PA and lateral chest radiographs are provided. There is bilateral diffuse
interstitial thickening likely representing interstitial edema versus
interstitial pneumonitis secondary to an infectious or inflammatory
etiology. There is no focal parenchymal opacity, pleural effusion, or
pneumothorax. The heart and mediastinum are unremarkable.  The osseous
structures are unremarkable.
IMPRESSION: There is bilateral diffuse interstitial thickening likely representing
interstitial edema versus interstitial pneumonitis secondary to an
infectious or inflammatory etiology.

[REDACTED]

## 2014-11-30 ENCOUNTER — Ambulatory Visit
Admit: 2014-11-30 | Disposition: A | Discharge status: Home / Self Care | Payer: Self-pay | Attending: Internal Medicine | Admitting: Internal Medicine

## 2014-12-18 NOTE — Discharge Summary (Signed)
PATIENT NAME:  [name], [name] MR#:  [id number] DATE OF BIRTH:  Jan 14, 1919  DATE OF ADMISSION:  08/27/2012 DATE OF DISCHARGE:  08/29/2012  PRIMARY CARE PHYSICIAN: Dr. Dion Body.   DISCHARGE DIAGNOSES:  1.  Acute myocardial infarction with non-ST elevation myocardial infarction.  2.  Congestive heart failure.  3.  Hypertension.   HISTORY OF PRESENT ILLNESS: Please see admission note from August 27, 2012. In brief, this is a 81 year old with history of hypertension, CVA, anemia, essential thrombosis, who presented to the nursing home with chest pain after elevated blood pressure. She was found to have positive troponin and markedly elevated blood pressure. She was seen by cardiology who recommended blood pressure and lipid control and to start Plavix. Medical management was elected.   Hospital course by issue:  1.  Chest pain: Resolved with treatment and she has been ambulatory and feeling much better. Plan is to discharge her on Plavix as well as blood pressure and lipid control.  2.  Hypertension: Her blood pressure was markedly elevated on admission. She was continued on her outpatient medications of hydrochlorothiazide/valsartan 12.5/320, as well as metoprolol. Hydralazine was added to this as was a nitroglycerin patch, which helped control her blood pressure. At discharge, she will be sent home on the hydralazine at least temporarily and Imdur in place of the nitroglycerin patch.  3.  Congestive heart failure: Echo was done with an ejection fraction of 40%. There was moderate to severe mitral regurgitation. No signs of volume overload at this point.   DISCHARGE MEDICATIONS:  1.  Plavix 75 mg once a day.  2.  Hydralazine 25 mg 4 times a day.  3.  Imdur 30 mg extended-release 1 tablet a day.  4.  Lipitor 10 mg once a day.  5.  Nitroglycerin 0.4 mg p.r.n. chest pain.  6.  Ferrex 150 once a day.  7.  Hydrochlorothiazide/valsartan 12.5/320 one tablet daily.  8.  Metoprolol 50 mg  extended-release 1 tablet daily.  9.  Omeprazole 20 mg 2 times a day.  10.  Potassium chloride 20 mEq once a day.  11.  Vitamin B12 at 1000 mcg daily.  12.  Vitamin E once a day.  13.  Hydroxyurea 500 mg 1 to 2 capsules as directed.   DISCHARGE INSTRUCTIONS: The patient is to take nitroglycerin as needed for chest pain. She is to call or return if she has worsening or recurrent symptoms. She will follow up with cardiology and with Dr. Netty Starring in 1 to 2 weeks.   DISCHARGE DIET: Low-sodium, low-fat diet.   This discharge took 35 minutes.     ____________________________ Cheral Marker. Ola Spurr, MD dpf:th D: 08/29/2012 18:44:56 ET Hudson: 08/29/2012 19:59:31 ET JOB#: [id number]  cc: Cheral Marker. Ola Spurr, MD, <Dictator> Markes Shatswell Ola Spurr MD ELECTRONICALLY SIGNED 08/30/2012 21:54

## 2014-12-18 NOTE — H&P (Signed)
PATIENT NAME:  [name], [name] MR#:  [id number] DATE OF BIRTH:  1919/06/19  DATE OF ADMISSION:  08/27/2012  REFERRING PHYSICIAN:  Graciella Freer, MD  FAMILY PHYSICIAN:   Dorna Mai. MD  REASON FOR ADMISSION: Chest pain with malignant hypertension.   HISTORY OF PRESENT ILLNESS: The patient is a 81 year old female with a history of hypertension, previous stroke, chronic anemia, essential thrombocytosis who states that she has a history of "angina". No documented coronary artery disease in the past. Has been taking Nitrostat at home but ran out. She presents to the Emergency Room this morning with recurrent chest pain where she was noted to be malignantly hypertensive.  She was given IV hydralazine and nitro paste in the Emergency Room with improvement of her blood pressure. Her chest pain has resolved. Her troponin is mildly elevated. Chest x-ray suggests congestive heart failure. She is admitted for further evaluation.   PAST MEDICAL HISTORY: 1.  Anemia of chronic disease.  2.  Essential thrombocytosis.  3.  Previous right body stroke.  4.  Benign hypertension.  5.  Recurrent UTIs.  6.  History of squamous cell skin cancer.  7.  Status post hysterectomy. 8.  Status post appendectomy.   MEDICATIONS: 1.  Ferrex 150 mg p.o. daily.  2.  Prilosec 20 mg p.o. daily.  3.  Toprol-XL 50 mg p.o. daily.  4.  Klor-Con 20 mEq p.o. daily.  5.  Diovan HCT 320/12.5, 1 p.o. daily.  6.  Hydroxyurea 500 mg p.o. daily except Saturdays.  7.  B12 1000 mcg p.o. daily.  ALLERGIES: 1.  CODEINE. 2.  DARVOCET. 3.  ULTRAM. 4.  FOSAMAX. 5.  EVISTA. [place].  SOCIAL HISTORY: Negative for alcohol or tobacco abuse.   FAMILY HISTORY: Positive for coronary artery disease and stroke. Negative for colon or breast cancer.   REVIEW OF SYSTEMS:   CONSTITUTIONAL: No fever or change in weight.  EYES: No blurred or double vision. No glaucoma.  ENT: No tinnitus or hearing loss. No nasal discharge or bleeding. No  difficulty swallowing.  RESPIRATORY: No cough or wheezing. Denies hemoptysis. No painful respiration.  CARDIOVASCULAR: No orthopnea or palpitations. No syncope.  GASTROINTESTINAL: No nausea, vomiting or diarrhea. No abdominal pain. No change in bowel habits.  GENITOURINARY: No dysuria or hematuria. No incontinence.  ENDOCRINE: No polyuria or polydipsia. No heat or cold intolerance.  HEMATOLOGIC: The patient admits to anemia but denies easy bruising or bleeding.  LYMPHATIC: No swollen glands.  MUSCULOSKELETAL: The patient denies pain in her neck, back, shoulders, knees or hips. No gout.  NEUROLOGIC: No numbness, although she does feel weak. Denies migraines. No seizures. Stroke as per history of present illness.  PSYCHIATRIC: The patient denies anxiety, insomnia or depression.   PHYSICAL EXAMINATION: GENERAL: The patient is in no acute distress.  VITAL SIGNS: Vital signs are initially remarkable for a blood pressure of 220/118 with a blood pressure now of 169/85. Heart rate is 85 with a respiratory rate of 20. She is afebrile.  HEENT: Normocephalic, atraumatic. Pupils equal, round, reactive to light and accommodation. Extraocular movements are intact. Sclerae are not icteric. Conjunctivae are clear.  Oropharynx is clear. NECK: Supple without jugular venous distention. No bruits. No adenopathy or thyromegaly is noted.  LUNGS: Basilar rales without wheezes or rhonchi. No dullness.  CARDIAC: Regular rate and rhythm with normal S1 and S2. No significant rubs, murmurs or gallops. PMI is nondisplaced. Chest wall is nontender.  ABDOMEN: Soft, nontender with normoactive bowel sounds. No organomegaly  or masses were appreciated. No hernias or bruits were noted.  EXTREMITIES: Without clubbing, cyanosis or edema. Pulses were 1+ bilaterally.  SKIN: Warm and dry without rash or lesions.  NEUROLOGIC: Cranial nerves II through XII grossly intact. Deep tendon reflexes were symmetric. Motor and sensory exam is  nonfocal.  PSYCHIATRIC:  Revealed a patient who was alert and oriented to person, place and time. She was cooperative and used good judgment.   LABORATORY AND DIAGNOSTIC DATA:  EKG revealed sinus rhythm, 77 beats per minute with no acute ischemic changes. Chest x-ray showed pulmonary edema. Her lipase was normal at 310. TSH was 5.52. Troponin was 0.06. White count 4.2 with a hemoglobin of 12.0 and a platelet count of 414,000. Glucose was 126 with a BUN of 22 and a creatinine 1.31 with a GFR of 36.   ASSESSMENT: 1.  Malignant hypertension.  2.  Chest pain worrisome for unstable angina.  3.  Elevated troponin.  4.  Anemia of chronic disease.  5.  Essential thrombocytosis.  6.  Stage III chronic kidney disease.  7.  Congestive heart failure, presumably acute diastolic.  8.  Previous stroke.   PLAN: The patient will be admitted to telemetry with nitro paste, Lovenox, Plavix and continued Diovan and Lopressor. We will add hydralazine at this time with the nitro paste for better blood pressure control. We will give Lasix IV x 1 now and increase her hydrochlorothiazide starting tomorrow.  We will follow serial cardiac enzymes and obtain an echocardiogram. We will consult cardiology because of her CHF and elevated troponin. Follow up chest x-ray in the morning. We will supplement oxygen at this time and wean as tolerated. Follow up routine labs in the morning. Further treatment and evaluation will depend upon the patient's progress.   Total time spent on this patient: 50 minutes.    ____________________________ Leonie Douglas Doy Hutching, MD jds:ct D: 08/27/2012 10:53:05 ET T: 08/28/2012 10:12:17 ET JOB#: [id number]  cc: Leonie Douglas. Doy Hutching, MD, <Dictator> Dion Body, MD  Yolando Gillum Lennice Sites MD ELECTRONICALLY SIGNED 08/28/2012 13:17

## 2014-12-21 NOTE — Consult Note (Signed)
PATIENT NAME:  [name], [name] MR#:  [id number] DATE OF BIRTH:  [birth year]  DATE OF CONSULTATION:  09/06/2012  REFERRING PHYSICIAN AND PRIMARY CARE PHYSICIAN: Dr. Netty Starring.  CONSULTING PHYSICIAN:  Corey Skains, MD  REASON FOR CONSULTATION: Acute unstable angina with known coronary artery disease and recent myocardial infarction.   CHIEF COMPLAINT: Chest pain.   HISTORY OF PRESENT ILLNESS: This is a 81 year old female who recently was admitted to [facility] for acute onset of chest discomfort. She had a mild myocardial infarctions with mild LV systolic dysfunction and some valvular heart disease by an echocardiogram. She had significant hypertension for which she was placed on appropriate medication management. She elected to have no further intervention, with medication management, placed on appropriate medications and discharged home. She ended up having a recurrent episode of chest discomfort and was seen in the Emergency Room. At that time, she had an EKG showing normal sinus rhythm with nonspecific ST and Hudson wave changes, but no evidence of ongoing acute myocardial infarction. Troponin was 0.12. At that time, the patient had relief with nitroglycerin. Currently, she is relieved with nitroglycerin and no further episodes of chest pain.   REVIEW OF SYSTEMS: The remainder of review of systems negative for vision change, ringing in ears, hearing loss, cough, congestion, heartburn, nausea, vomiting, diarrhea, bloody stools, stomach pain, extremity pain, leg weakness, cramping of the buttocks, known blood clots, headaches, blackouts, dizzy spells, nosebleeds, congestion, trouble swallowing, frequent urination, urination at night, muscle weakness, numbness, anxiety, depression, skin lesions or skin rashes.   PAST MEDICAL HISTORY:  1. Coronary artery disease. 2. Myocardial infarction.  3. Chronic kidney disease.  4. Hyperlipidemia.   FAMILY HISTORY: No family members with early onset of cardiovascular  disease.   SOCIAL HISTORY: Currently denies alcohol or tobacco use.  ALLERGIES: She has no known drug allergies.   CURRENT MEDICATIONS: As listed.   PHYSICAL EXAMINATION:  VITAL SIGNS: Blood pressure is 136/68 bilaterally, heart rate 72 upright, reclining, and regular.  GENERAL: She is a well-appearing female in no acute distress.  HEAD, EYES, EARS, NOSE AND THROAT: No icterus, thyromegaly, ulcers, hemorrhage or xanthelasma.  CARDIOVASCULAR: Regular rate and rhythm with normal S1 and S2, without murmur, gallop or rub. PMI is diffuse. Carotid upstroke was normal without bruit. Jugular venous pressure is normal.  LUNGS: A few basilar crackles with normal respirations.  ABDOMEN: Soft, nontender, without hepatosplenomegaly or masses. Abdominal aorta is normal size without bruit.  EXTREMITIES: Show 2+ bilateral pulses in dorsal pedal, radial and femoral arteries, without lower extremity edema, cyanosis, clubbing or ulcers.  NEUROLOGIC: She is oriented to time, place and person, with normal mood and affect.   ASSESSMENT: A 81 year old female with recurrent chest discomfort after myocardial infarction, hypertension, hyperlipidemia, abnormal EKG, elevation of troponin, most consistent with demand ischemia at this time.   RECOMMENDATIONS:  1. Heparin for 24 hours for further treatment of possible myocardial infarction.  2. Serial ECG and enzymes to assess for myocardial infarction.  3. Discussed with the patient about further intervention and currently wishes not to further intervene, including cardiac catheterization, for which all risks and benefits are discussed. 4. Increase isosorbide to 60 mg twice per day.  5. Hydralazine with other medication management for hypertension control.  7. Further treatment options after above.    ____________________________ Corey Skains, MD bjk:OSi D: 09/07/2012 08:46:00 ET [place]: 09/07/2012 08:59:33 ET JOB#: [id number]  cc: Corey Skains, MD,  <Dictator> Corey Skains MD ELECTRONICALLY SIGNED 09/15/2012 8:26

## 2014-12-21 NOTE — Discharge Summary (Signed)
PATIENT NAME:  [name], [name] MR#:  [id number] DATE OF BIRTH:  03-20-1919  DATE OF ADMISSION:  09/06/2012 DATE OF DISCHARGE:  09/11/2012  DISCHARGE DIAGNOSES: 1.  Atypical chest pain.  2.  Anemia.  3.  Renal insufficiency.  4. Hypertension.   DISCHARGE MEDICATIONS: 1.  Ferrex 150 mg p.o. daily.  2.  Metoprolol succinate 50 mg p.o. daily.  3.  Omeprazole 20 mg p.o. b.i.d.  4.  Potassium chloride 20 mEq p.o. daily.  5.  Vitamin B12 1000 mcg daily.  6.  Hydroxyurea 500 mg 1 to 2 tabs capsules as directed.  7.  Lipitor 10 mg p.o. at bedtime.  8.  Nitroglycerin 0.4 mg every 5 minutes sublingual as needed, up to 3 doses.  9.  Imdur 60 mg p.o. b.i.d.  10.  Acetaminophen 325 mg 2 tabs every 4 hours as needed or 2 tabs every 6 hours scheduled.  11.  Aspirin 325 mg p.o. daily.   CONSULTS: Cardiology.   PROCEDURES: None.   PERTINENT LABORATORIES ON DISCHARGE:  Hemoglobin 10.8, sodium 143, potassium 3.6, creatinine 1.36, glucose 103.  Iron level 41, ferritin 67.   BRIEF HOSPITAL COURSE:   PROBLEM #1:  Atypical chest pain. The patient was initially admitted and in the ICU because of concern for possible MI. She did have slightly elevated troponins as high as 0.14 that came down to 0.2, had normal CK-MB. EKG did not show any acute changes. Her symptoms were atypical in the fact that they were mainly in the thoracic area of the back, improved with nitroglycerin, also improved with Tylenol. She was seen by Dr. Saralyn Pilar prior to discharge who did not recommend any further intervention and did not think this was cardiac. The patient was increased on the Imdur up to 60 mg b.i.d. per Dr. Alveria Apley recommendations.  The plan is to  follow-up as an outpatient for further evaluation per Dr. Nehemiah Massed.   PROBLEM #2:  Anemia.  The patient tested Hemoccult positive.  Baseline hemoglobin is typically around 11. It did drop down to 7.8 at the lowest.  Underwent 2 transfusions.  Hemoglobin was 10.8 on discharge.   Has known history of iron deficiency and is currently on iron supplementation.   PROBLEM #3:  Renal insufficiency. It is improving. Creatinine was as high as 1.75 and trended down to 1.36 on discharge with low rate IV fluids. Continue hydration. Will follow-up as an outpatient.   PROBLEM #4:  Hypertension that remained stable.  Held her thiazide and also her ARB because of the renal function. Her blood pressure remained stable throughout.  May restart that if renal function improves.   PROBLEM #5:  Instability. The patient was evaluated by physical therapy who recommended home health physical therapy and we will set this up for the patient as an outpatient. She is in stable condition to be discharged to home with home health PT, follow-up with Dr. Netty Starring within 10 days, Dr. Saralyn Pilar within 2 weeks.    ____________________________ Dion Body, MD kl:ct D: 09/11/2012 13:21:31 ET [place]: 09/12/2012 07:55:26 ET JOB#: [id number]  cc: Dion Body, MD, <Dictator> Isaias Cowman, MD Corey Skains, MD Dion Body MD ELECTRONICALLY SIGNED 09/16/2012 10:42

## 2014-12-21 NOTE — H&P (Signed)
PATIENT NAMEMARNIE, [name] MR#:  [id number] DATE OF BIRTH:  [birth year]  DATE OF ADMISSION:  09/06/2012  PRIMARY CARE PHYSICIAN: Dion Body, MD  PRIMARY CARDIOLOGIST: Corey Skains, MD  CHIEF COMPLAINT: Chest pain.  REFERRING PHYSICIAN: Dr. Owens Shark.  HISTORY OF PRESENT ILLNESS: The patient is a 81 year old pleasant Caucasian female with a recent past medical history of acute MI with non-ST-elevation myocardial infarction in December 2013 and was discharged on 08/29/2012, congestive heart failure with an ejection fraction of 40% during previous admission, hypertension, anemia of chronic disease, essential thrombocytosis and history of right-sided stroke is presenting to the ER with a chief complaint of chest pain. The patient is reporting that she had had dinner and slept last night. She woke up at around 12:30 a.m. in the middle of the night with chest pain. Chest pain was in the middle of the chest radiating to the neck and was pressure like. The patient has received sublingual nitroglycerin. Chest was resolved after taking third sublingual nitroglycerin. The patient was brought into the ER and a 12-lead EKG has revealed ST depressions in the anterolateral leads (V2 through V6 leads). Troponin is elevated at 0.012. Creatinine is also elevated at 1.70, but the patient's creatinine was high at 1.72 with a BUN of 41 at the time of discharge on 08/29/2012. The patient is placed on ACS protocol with oxygen, nitroglycerin and as per my discussion with Dr. Nehemiah Massed, heparin drip is initiated without bolusing her. The plan is to make her n.p.o. except meds, to be evaluated by Dr. Nehemiah Massed first thing in the morning. During my examination, the patient is chest pain-free. Denies any shortness of breath and feeling comfortable. Two granddaughters were at bedside. The patient denies any dizziness, diaphoresis, nausea, vomiting, abdominal pain, diarrhea. No other complaints.   PAST MEDICAL HISTORY: Recent  history of acute MI with non-ST-elevation MI and was discharged home on December 30th to continue conservative management only, congestive heart failure which is systolic in nature with an ejection fraction of 40%, hypertension, anemia of chronic disease, thrombocytosis, previous history of stroke on the right side, history of squamous cell skin cancer.   PAST SURGICAL HISTORY: Status post hysterectomy, appendectomy.  ALLERGIES: ALLERGIC TO CODEINE, DARVOCET, EVISTA, FOSAMAX, KEFLEX, ULTRAM.  HOME MEDICATIONS: Vitamin E 200 units one capsule once daily, potassium chloride 20 mEq p.o. once daily, Plavix 75 mg once daily, omeprazole 20 mg once daily, nitroglycerin 0.4 mg sublingually every five minutes x 3 as needed for chest pain, metoprolol succinate 50 mg once a day, Lipitor 10 mg once a day, Imdur 30 mg once a day, hydroxyurea 500 mg one to two capsules as directed, hydrochlorothiazide one tablet once daily, hydralazine 25 mg p.o. 4 times a day, Ferrex 150 mg one capsule once a day.   PSYCHOSOCIAL HISTORY: Lives alone at home. Denies smoking but dips nicotine sometimes. Denies alcohol or illicit drug usage.   FAMILY HISTORY: The patient's father had history of heart problems.  REVIEW OF SYSTEMS: CONSTITUTIONAL: Denies fever, fatigue, weakness. Comes in to the ER with a chief complaint of chest pain. Denies weight loss, weight gain.  EYES: Denies blurry vision, glaucoma, cataracts, redness.  ENT: Denies tinnitus, epistaxis, ear pain, discharge, difficulty in swallowing, postnasal drip.  RESPIRATORY: Denies cough, wheezing, COPD, dyspnea, asthma.  CARDIOVASCULAR: Complained of chest complaint which was resolved after third dose of sublingual nitroglycerin. Denies palpitations, syncope, arrhythmias.  GASTROINTESTINAL: Denies nausea, vomiting, diarrhea, abdominal pain, hematemesis, rectal bleeding, constipation.  GENITOURINARY: Denies dysuria,  hematuria, renal calculi.  GYNECOLOGIC/BREASTS: Denies  any breast mass or vaginal discharge.  ENDOCRINE: Denies polyuria, polyphagia, polydipsia, nocturia, thyroid problems, increased sweating. HEMATOLOGIC/LYMPHATIC: The patient has history of anemia of chronic disease, but no easy bruising.  INTEGUMENTARY: Denies acne, rash, lesions.  MUSCULOSKELETAL: Denies any pain in the neck, back, shoulder, knee, hip or swelling.  NEUROLOGIC: Denies numbness, weakness, vertigo, ataxia, dementia, headache, CVA or TIA. PSYCHIATRIC: Denies any insomnia, ADD, OCD, bipolar disorder.   PHYSICAL EXAMINATION: VITAL SIGNS: Temperature 97.1, pulse 87, respirations 16, blood pressure 198/80, pulse oximetry 98%.  GENERAL APPEARANCE: Not in any acute distress. Moderately built and moderately nourished.  HEENT: Normocephalic, atraumatic. Pupils are equally reactive to light and accommodation. No conjunctival injection. Extraocular movements are intact. ENT: No nasal congestion. No sinus tenderness. No postnasal drip. Her tympanic membranes are intact.  NECK: Supple. No JVD. No thyromegaly.  LUNGS: Clear to auscultation bilaterally. No accessory muscle usage. No anterior chest wall tenderness.  CARDIAC: S1, S2 normal. Regular rate and rhythm. Point of maximal impulse is intact. Peripheral pulses are 2+.  GASTROINTESTINAL: Soft. Bowel sounds are positive in all 4 quadrants. Nontender, nondistended. No masses felt. No rebound tenderness.  NEUROLOGIC: Awake, alert, oriented x 3. Cranial nerves II through XII are grossly intact. Reflexes are 2+. No cerebellar signs. No pronator drift.  SKIN: With no rashes or lesions. Normal turgor. History of squamous cell cancer of the skin.  MUSCULOSKELETAL: No joint effusion. No tenderness or erythema.  PSYCHIATRIC: Normal mood and affect.   LABORATORY DATA AND IMAGING STUDIES: The 12-lead EKG has revealed ST depressions in anterolateral leads from V2 through V6, normal sinus rhythm rate 88 beats per minute, normal PR interval. Chest x-ray:  No acute findings. Glucose 136, BUN 44, creatinine 1.70, sodium is 146, potassium 4.0, chloride 112, CO2 is 21, GFR 26. The patient's troponin this time is 0.12. During previous admission on the 29th of December, CK is 20.8 with a troponin 15.0. WBC 5.3, hemoglobin 9.6, hematocrit 28.7, platelet count is 485,000, MCV 110. Urinalysis: Clear in appearance, glucose negative, bilirubin negative, ketones negative, specific gravity 1.013, nitrite negative, leukocyte esterase 2+.   ASSESSMENT AND PLAN: A 81 year old female presenting to the Emergency Room with a chief complaint of chest pain with positive troponin, ST depressions in anterolateral leads, chest pain resolved with third dose of sublingual nitroglycerin. Will be admitted to inpatient status with the following 1. Chest pain from non-STEMI. Plan is: a. Admit to CCU.  b. Heparin drip is started without bolusing her as per my discussion with Dr. Nehemiah Massed.  c. Cardiology consult is placed.  d. ACS protocol with oxygen, nitroglycerin, aspirin, beta blocker and statin is implemented. Strict bed rest is ordered. Cardiology consult is placed to Dr. Nehemiah Massed and discussed with him.  2. Hypertension is uncontrolled. Will resume home medications and up-titrate as needed.  3. Acute cystitis. Will send urine culture and sensitivity and will initiate antibiotics.  4. History of systolic congestive heart failure with ejection fraction 40%. Currently, the patient is not volume overloaded. Will provide gentle hydration with IV fluids while the patient is n.p.o.  5. Thrombocytosis which is chronic in nature. No intervention is needed at this time.  6. Anemia of chronic disease. Monitor hemoglobin closely as the patient is going to be on heparin drip.  7. The patient is full code.  7. Will provide her GI prophylaxis with proton pump inhibitor.   The diagnosis and plan of care was discussed in detail with the  patient and her two granddaughters who are at bedside.  They both verbalized understanding of the plan.   Total critical care time spent is 60 minutes.  The patient will be transferred to [place], to Dr. Netty Starring in a.m.     ____________________________ Nicholes Mango, MD ag:es D: 09/06/2012 05:16:56 ET T: 09/06/2012 10:47:36 ET JOB#: [id number]  cc: Nicholes Mango, MD, <Dictator> Nicholes Mango MD ELECTRONICALLY SIGNED 09/15/2012 7:28

## 2014-12-21 NOTE — Consult Note (Signed)
PATIENT NAMEKASIAH, Hudson MR#:  [id number] DATE OF BIRTH:  [birth year]  DATE OF CONSULTATION:  08/28/2012  REFERRING PHYSICIAN:  Fulton Reek, MD CONSULTING PHYSICIAN:  Corey Skains, MD  REASON FOR CONSULTATION: Chest pain with unstable angina.   CHIEF COMPLAINT: "I have chest pain."   HISTORY OF PRESENT ILLNESS: This is a 81 year old female with known hypertension and hyperlipidemia, who has had intermittent episodes of chest pain in recent months with extensive chest discomfort with rest at this time, lasting several hours.  The patient had relief with nitroglycerin and oxygen, and has now had a troponin elevation of 9. The patient's EKG today shows normal sinus rhythm, normal EKG. The patient has had reasonable treatment of other significant issues including hypertension and hyperlipidemia on appropriate medications.   REVIEW OF SYSTEMS: The remainder review of systems negative for vision change, ringing in the ears, hearing loss, cough, congestion, heartburn, nausea, vomiting, diarrhea, bloody stool, stomach pain, extremity pain, leg weakness, cramping of the buttocks, known blood clots, headaches, blackouts, dizzy spells, nosebleed, congestion, trouble swallowing, frequent urination, urination at night, muscle weakness, numbness, anxiety, depression, skin lesions or skin rashes.   PAST MEDICAL HISTORY: 1.  Hypertension.  2.  Hyperlipidemia.   FAMILY HISTORY: No family members with early onset of cardiovascular disease.   SOCIAL HISTORY: Currently denies alcohol or tobacco use.   ALLERGIES: No known drug allergies.   CURRENT MEDICATIONS: As listed.   PHYSICAL EXAMINATION: VITAL SIGNS: Blood pressure 136/68 bilaterally, heart rate 72 upright reclining and regular.  GENERAL: She is a well-appearing female in no acute distress.  HEENT: No icterus, thyromegaly, ulcers, hemorrhage or xanthelasma.  HEART: Regular rate and rhythm. Normal S1, S2 without murmur, gallop or rub. PMI is  normal size and placement. Carotid upstroke normal without bruit. Jugular venous pressure is normal.  LUNGS: Lungs have few basilar crackles with normal respirations.  ABDOMEN: Soft, nontender, without hepatosplenomegaly or masses. Abdominal aorta is normal size without bruit.  EXTREMITIES: Show 2+ bilateral pulses in dorsal pedal, radial and femoral arteries without lower extremity edema, cyanosis, clubbing or ulcers.  NEUROLOGIC: She is oriented to time, place and person with normal mood and affect.   ASSESSMENT: A 81 year old female with acute myocardial infarction, chest pain, needing further treatment options.   RECOMMENDATIONS: 1.  Heparin for 24 to 48 hours after myocardial infarction.  2.  Continue beta blocker and/or aspirin for myocardial infarction.  3.  Echocardiogram for LV systolic dysfunction, valvular heart disease contributing to above.  4.  Further cardiac diagnostics thereof afterwards.       ____________________________ Corey Skains, MD bjk:cs D: 08/28/2012 09:03:46 ET T: 08/28/2012 20:18:15 ET JOB#: [id number]  cc: Corey Skains, MD, <Dictator> Corey Skains MD ELECTRONICALLY SIGNED 09/15/2012 8:24

## 2014-12-27 LAB — CBC CANCER CENTER
BASOS ABS: 0 x10 3/mm (ref 0.0–0.1)
BASOS PCT: 0.5 %
EOS PCT: 0.9 %
Eosinophil #: 0 x10 3/mm (ref 0.0–0.7)
HCT: 28.3 % — AB (ref 35.0–47.0)
HGB: 9.6 g/dL — ABNORMAL LOW (ref 12.0–16.0)
LYMPHS ABS: 1.3 x10 3/mm (ref 1.0–3.6)
LYMPHS PCT: 29.1 %
MCH: 39.6 pg — ABNORMAL HIGH (ref 26.0–34.0)
MCHC: 34 g/dL (ref 32.0–36.0)
MCV: 116 fL — AB (ref 80–100)
MONOS PCT: 6.1 %
Monocyte #: 0.3 x10 3/mm (ref 0.2–0.9)
Neutrophil #: 2.8 x10 3/mm (ref 1.4–6.5)
Neutrophil %: 63.4 %
Platelet: 283 x10 3/mm (ref 150–440)
RBC: 2.43 10*6/uL — ABNORMAL LOW (ref 3.80–5.20)
RDW: 14 % (ref 11.5–14.5)
WBC: 4.4 x10 3/mm (ref 3.6–11.0)

## 2015-01-17 ENCOUNTER — Inpatient Hospital Stay: Payer: Medicare Other | Attending: Internal Medicine

## 2015-01-17 ENCOUNTER — Other Ambulatory Visit: Payer: Self-pay | Admitting: *Deleted

## 2015-01-17 DIAGNOSIS — D473 Essential (hemorrhagic) thrombocythemia: Secondary | ICD-10-CM | POA: Diagnosis present

## 2015-01-17 LAB — CBC WITH DIFFERENTIAL/PLATELET
Basophils Absolute: 0 10*3/uL (ref 0–0.1)
Basophils Relative: 0 %
Eosinophils Absolute: 0.1 10*3/uL (ref 0–0.7)
Eosinophils Relative: 1 %
HCT: 31 % — ABNORMAL LOW (ref 35.0–47.0)
Hemoglobin: 10.4 g/dL — ABNORMAL LOW (ref 12.0–16.0)
LYMPHS ABS: 1 10*3/uL (ref 1.0–3.6)
LYMPHS PCT: 16 %
MCH: 39 pg — AB (ref 26.0–34.0)
MCHC: 33.5 g/dL (ref 32.0–36.0)
MCV: 116.4 fL — ABNORMAL HIGH (ref 80.0–100.0)
Monocytes Absolute: 0.4 10*3/uL (ref 0.2–0.9)
Monocytes Relative: 7 %
Neutro Abs: 4.6 10*3/uL (ref 1.4–6.5)
Neutrophils Relative %: 76 %
PLATELETS: 384 10*3/uL (ref 150–440)
RBC: 2.67 MIL/uL — AB (ref 3.80–5.20)
RDW: 13.6 % (ref 11.5–14.5)
WBC: 6.1 10*3/uL (ref 3.6–11.0)

## 2015-02-11 ENCOUNTER — Other Ambulatory Visit: Payer: Self-pay | Admitting: Internal Medicine

## 2015-02-14 ENCOUNTER — Inpatient Hospital Stay: Payer: Medicare Other | Attending: Internal Medicine

## 2015-02-14 DIAGNOSIS — D473 Essential (hemorrhagic) thrombocythemia: Secondary | ICD-10-CM | POA: Diagnosis not present

## 2015-02-14 LAB — CBC WITH DIFFERENTIAL/PLATELET
BASOS PCT: 1 %
Basophils Absolute: 0 10*3/uL (ref 0–0.1)
EOS ABS: 0.1 10*3/uL (ref 0–0.7)
EOS PCT: 2 %
HEMATOCRIT: 31.5 % — AB (ref 35.0–47.0)
HEMOGLOBIN: 10.5 g/dL — AB (ref 12.0–16.0)
Lymphocytes Relative: 28 %
Lymphs Abs: 1.1 10*3/uL (ref 1.0–3.6)
MCH: 39.1 pg — AB (ref 26.0–34.0)
MCHC: 33.4 g/dL (ref 32.0–36.0)
MCV: 117.2 fL — AB (ref 80.0–100.0)
MONOS PCT: 9 %
Monocytes Absolute: 0.3 10*3/uL (ref 0.2–0.9)
Neutro Abs: 2.3 10*3/uL (ref 1.4–6.5)
Neutrophils Relative %: 60 %
Platelets: 340 10*3/uL (ref 150–440)
RBC: 2.69 MIL/uL — ABNORMAL LOW (ref 3.80–5.20)
RDW: 13.1 % (ref 11.5–14.5)
WBC: 3.9 10*3/uL (ref 3.6–11.0)

## 2015-03-27 ENCOUNTER — Inpatient Hospital Stay: Payer: Medicare Other | Attending: Family Medicine

## 2015-03-27 ENCOUNTER — Other Ambulatory Visit: Payer: Self-pay

## 2015-03-27 DIAGNOSIS — D539 Nutritional anemia, unspecified: Secondary | ICD-10-CM | POA: Insufficient documentation

## 2015-03-27 DIAGNOSIS — D473 Essential (hemorrhagic) thrombocythemia: Secondary | ICD-10-CM | POA: Insufficient documentation

## 2015-03-27 LAB — CBC WITH DIFFERENTIAL/PLATELET
Basophils Absolute: 0 10*3/uL (ref 0–0.1)
Basophils Relative: 0 %
EOS PCT: 2 %
Eosinophils Absolute: 0.1 10*3/uL (ref 0–0.7)
HCT: 31.5 % — ABNORMAL LOW (ref 35.0–47.0)
HEMOGLOBIN: 10.5 g/dL — AB (ref 12.0–16.0)
LYMPHS ABS: 1 10*3/uL (ref 1.0–3.6)
Lymphocytes Relative: 20 %
MCH: 38.5 pg — AB (ref 26.0–34.0)
MCHC: 33.2 g/dL (ref 32.0–36.0)
MCV: 115.8 fL — AB (ref 80.0–100.0)
Monocytes Absolute: 0.4 10*3/uL (ref 0.2–0.9)
Monocytes Relative: 8 %
Neutro Abs: 3.4 10*3/uL (ref 1.4–6.5)
Neutrophils Relative %: 70 %
Platelets: 400 10*3/uL (ref 150–440)
RBC: 2.72 MIL/uL — ABNORMAL LOW (ref 3.80–5.20)
RDW: 12.6 % (ref 11.5–14.5)
WBC: 4.9 10*3/uL (ref 3.6–11.0)

## 2015-03-27 LAB — COMPREHENSIVE METABOLIC PANEL
ALT: 11 U/L — ABNORMAL LOW (ref 14–54)
AST: 19 U/L (ref 15–41)
Albumin: 4.1 g/dL (ref 3.5–5.0)
Alkaline Phosphatase: 70 U/L (ref 38–126)
Anion gap: 7 (ref 5–15)
BILIRUBIN TOTAL: 0.8 mg/dL (ref 0.3–1.2)
BUN: 35 mg/dL — ABNORMAL HIGH (ref 6–20)
CO2: 24 mmol/L (ref 22–32)
Calcium: 8.9 mg/dL (ref 8.9–10.3)
Chloride: 110 mmol/L (ref 101–111)
Creatinine, Ser: 1.52 mg/dL — ABNORMAL HIGH (ref 0.44–1.00)
GFR, EST AFRICAN AMERICAN: 33 mL/min — AB (ref >60)
GFR, EST NON AFRICAN AMERICAN: 28 mL/min — AB (ref >60)
GLUCOSE: 121 mg/dL — AB (ref 65–99)
POTASSIUM: 4.1 mmol/L (ref 3.5–5.1)
Sodium: 141 mmol/L (ref 135–145)
TOTAL PROTEIN: 6.9 g/dL (ref 6.5–8.1)

## 2015-04-21 ENCOUNTER — Encounter: Payer: Self-pay | Admitting: *Deleted

## 2015-04-21 ENCOUNTER — Observation Stay
Admission: EM | Admit: 2015-04-21 | Discharge: 2015-04-22 | Disposition: A | Discharge status: Home / Self Care | Payer: Medicare Other | Attending: Internal Medicine | Admitting: Internal Medicine

## 2015-04-21 ENCOUNTER — Emergency Department: Payer: Medicare Other

## 2015-04-21 DIAGNOSIS — I129 Hypertensive chronic kidney disease with stage 1 through stage 4 chronic kidney disease, or unspecified chronic kidney disease: Secondary | ICD-10-CM | POA: Insufficient documentation

## 2015-04-21 DIAGNOSIS — I200 Unstable angina: Secondary | ICD-10-CM | POA: Diagnosis present

## 2015-04-21 DIAGNOSIS — Z66 Do not resuscitate: Secondary | ICD-10-CM | POA: Diagnosis not present

## 2015-04-21 DIAGNOSIS — D473 Essential (hemorrhagic) thrombocythemia: Secondary | ICD-10-CM | POA: Insufficient documentation

## 2015-04-21 DIAGNOSIS — Z9889 Other specified postprocedural states: Secondary | ICD-10-CM | POA: Diagnosis not present

## 2015-04-21 DIAGNOSIS — X58XXXA Exposure to other specified factors, initial encounter: Secondary | ICD-10-CM | POA: Diagnosis not present

## 2015-04-21 DIAGNOSIS — R0600 Dyspnea, unspecified: Secondary | ICD-10-CM | POA: Diagnosis not present

## 2015-04-21 DIAGNOSIS — D649 Anemia, unspecified: Secondary | ICD-10-CM | POA: Insufficient documentation

## 2015-04-21 DIAGNOSIS — I252 Old myocardial infarction: Secondary | ICD-10-CM | POA: Insufficient documentation

## 2015-04-21 DIAGNOSIS — Z79899 Other long term (current) drug therapy: Secondary | ICD-10-CM | POA: Insufficient documentation

## 2015-04-21 DIAGNOSIS — I25110 Atherosclerotic heart disease of native coronary artery with unstable angina pectoris: Principal | ICD-10-CM | POA: Insufficient documentation

## 2015-04-21 DIAGNOSIS — Z7982 Long term (current) use of aspirin: Secondary | ICD-10-CM | POA: Insufficient documentation

## 2015-04-21 DIAGNOSIS — R0789 Other chest pain: Secondary | ICD-10-CM | POA: Diagnosis not present

## 2015-04-21 DIAGNOSIS — I214 Non-ST elevation (NSTEMI) myocardial infarction: Secondary | ICD-10-CM

## 2015-04-21 DIAGNOSIS — Z90710 Acquired absence of both cervix and uterus: Secondary | ICD-10-CM | POA: Insufficient documentation

## 2015-04-21 DIAGNOSIS — K219 Gastro-esophageal reflux disease without esophagitis: Secondary | ICD-10-CM | POA: Diagnosis present

## 2015-04-21 DIAGNOSIS — R0682 Tachypnea, not elsewhere classified: Secondary | ICD-10-CM | POA: Insufficient documentation

## 2015-04-21 DIAGNOSIS — Z8719 Personal history of other diseases of the digestive system: Secondary | ICD-10-CM

## 2015-04-21 DIAGNOSIS — S2241XA Multiple fractures of ribs, right side, initial encounter for closed fracture: Secondary | ICD-10-CM | POA: Diagnosis not present

## 2015-04-21 DIAGNOSIS — M810 Age-related osteoporosis without current pathological fracture: Secondary | ICD-10-CM | POA: Diagnosis not present

## 2015-04-21 DIAGNOSIS — I10 Essential (primary) hypertension: Secondary | ICD-10-CM | POA: Diagnosis present

## 2015-04-21 DIAGNOSIS — Z809 Family history of malignant neoplasm, unspecified: Secondary | ICD-10-CM | POA: Insufficient documentation

## 2015-04-21 DIAGNOSIS — D75839 Thrombocytosis, unspecified: Secondary | ICD-10-CM | POA: Diagnosis present

## 2015-04-21 DIAGNOSIS — I2510 Atherosclerotic heart disease of native coronary artery without angina pectoris: Secondary | ICD-10-CM | POA: Diagnosis not present

## 2015-04-21 DIAGNOSIS — Z888 Allergy status to other drugs, medicaments and biological substances status: Secondary | ICD-10-CM | POA: Diagnosis not present

## 2015-04-21 DIAGNOSIS — Z8673 Personal history of transient ischemic attack (TIA), and cerebral infarction without residual deficits: Secondary | ICD-10-CM | POA: Diagnosis not present

## 2015-04-21 DIAGNOSIS — Z823 Family history of stroke: Secondary | ICD-10-CM | POA: Insufficient documentation

## 2015-04-21 DIAGNOSIS — N184 Chronic kidney disease, stage 4 (severe): Secondary | ICD-10-CM | POA: Diagnosis not present

## 2015-04-21 HISTORY — DX: Non-ST elevation (NSTEMI) myocardial infarction: I21.4

## 2015-04-21 HISTORY — DX: Atherosclerotic heart disease of native coronary artery without angina pectoris: I25.10

## 2015-04-21 HISTORY — DX: Gastro-esophageal reflux disease without esophagitis: K21.9

## 2015-04-21 HISTORY — DX: Essential (hemorrhagic) thrombocythemia: D47.3

## 2015-04-21 HISTORY — DX: Chronic kidney disease, unspecified: N18.9

## 2015-04-21 HISTORY — DX: Anemia, unspecified: D64.9

## 2015-04-21 HISTORY — DX: Essential (primary) hypertension: I10

## 2015-04-21 HISTORY — DX: Thrombocytosis, unspecified: D75.839

## 2015-04-21 HISTORY — DX: Age-related osteoporosis without current pathological fracture: M81.0

## 2015-04-21 HISTORY — DX: Diverticulosis of intestine, part unspecified, without perforation or abscess without bleeding: K57.90

## 2015-04-21 HISTORY — DX: Gastrointestinal hemorrhage, unspecified: K92.2

## 2015-04-21 HISTORY — DX: Transient cerebral ischemic attack, unspecified: G45.9

## 2015-04-21 MED ORDER — ONDANSETRON HCL 4 MG/2ML IJ SOLN
4.0000 mg | Freq: Once | INTRAMUSCULAR | Status: AC
  Administered 2015-04-22: 4 mg via INTRAVENOUS
  Filled 2015-04-21: qty 2

## 2015-04-21 NOTE — ED Notes (Signed)
Pt presents w/ c/o chest pain starting at 2200. Pt experienced dyspnea, continues to be tachypneic. Pt denies diaphoresis, and n/v at this time. Pt is A&O x 4. Lives alone w/ family caregiver close. Pt took NTG x 2 tabs, 0.4 mg SL @ home. EMS started PIV #18 ga in LAC, NTG paste x 1 inch applied, ASA 81 mg x 4 tabs administered.

## 2015-04-22 ENCOUNTER — Encounter: Payer: Self-pay | Admitting: Internal Medicine

## 2015-04-22 ENCOUNTER — Observation Stay
Admit: 2015-04-22 | Discharge: 2015-04-22 | Disposition: A | Discharge status: Home / Self Care | Payer: Medicare Other | Attending: Internal Medicine | Admitting: Internal Medicine

## 2015-04-22 DIAGNOSIS — I2510 Atherosclerotic heart disease of native coronary artery without angina pectoris: Secondary | ICD-10-CM | POA: Diagnosis present

## 2015-04-22 DIAGNOSIS — D75839 Thrombocytosis, unspecified: Secondary | ICD-10-CM | POA: Diagnosis present

## 2015-04-22 DIAGNOSIS — I200 Unstable angina: Secondary | ICD-10-CM | POA: Diagnosis present

## 2015-04-22 DIAGNOSIS — K219 Gastro-esophageal reflux disease without esophagitis: Secondary | ICD-10-CM | POA: Diagnosis present

## 2015-04-22 DIAGNOSIS — D473 Essential (hemorrhagic) thrombocythemia: Secondary | ICD-10-CM | POA: Diagnosis present

## 2015-04-22 DIAGNOSIS — N184 Chronic kidney disease, stage 4 (severe): Secondary | ICD-10-CM | POA: Diagnosis present

## 2015-04-22 DIAGNOSIS — I10 Essential (primary) hypertension: Secondary | ICD-10-CM | POA: Diagnosis present

## 2015-04-22 DIAGNOSIS — Z8719 Personal history of other diseases of the digestive system: Secondary | ICD-10-CM

## 2015-04-22 LAB — CBC WITH DIFFERENTIAL/PLATELET
Basophils Absolute: 0.1 10*3/uL (ref 0–0.1)
Basophils Relative: 1 %
EOS ABS: 0.1 10*3/uL (ref 0–0.7)
EOS PCT: 2 %
HCT: 30.8 % — ABNORMAL LOW (ref 35.0–47.0)
Hemoglobin: 10.6 g/dL — ABNORMAL LOW (ref 12.0–16.0)
LYMPHS ABS: 0.4 10*3/uL — AB (ref 1.0–3.6)
Lymphocytes Relative: 4 %
MCH: 39.6 pg — AB (ref 26.0–34.0)
MCHC: 34.3 g/dL (ref 32.0–36.0)
MCV: 115.5 fL — AB (ref 80.0–100.0)
MONO ABS: 0.5 10*3/uL (ref 0.2–0.9)
MONOS PCT: 6 %
Neutro Abs: 7.2 10*3/uL — ABNORMAL HIGH (ref 1.4–6.5)
Neutrophils Relative %: 87 %
PLATELETS: 369 10*3/uL (ref 150–440)
RBC: 2.67 MIL/uL — AB (ref 3.80–5.20)
RDW: 12.7 % (ref 11.5–14.5)
WBC: 8.2 10*3/uL (ref 3.6–11.0)

## 2015-04-22 LAB — TROPONIN I
TROPONIN I: 0.06 ng/mL — AB (ref <0.031)
Troponin I: 0.06 ng/mL — ABNORMAL HIGH (ref <0.031)
Troponin I: <0.03 ng/mL (ref <0.031)

## 2015-04-22 LAB — CBC
HEMATOCRIT: 28.8 % — AB (ref 35.0–47.0)
Hemoglobin: 9.7 g/dL — ABNORMAL LOW (ref 12.0–16.0)
MCH: 38.7 pg — ABNORMAL HIGH (ref 26.0–34.0)
MCHC: 33.6 g/dL (ref 32.0–36.0)
MCV: 115.2 fL — ABNORMAL HIGH (ref 80.0–100.0)
Platelets: 304 10*3/uL (ref 150–440)
RBC: 2.5 MIL/uL — ABNORMAL LOW (ref 3.80–5.20)
RDW: 12.8 % (ref 11.5–14.5)
WBC: 6.4 10*3/uL (ref 3.6–11.0)

## 2015-04-22 LAB — BASIC METABOLIC PANEL
Anion gap: 8 (ref 5–15)
Anion gap: 8 (ref 5–15)
BUN: 27 mg/dL — AB (ref 6–20)
BUN: 32 mg/dL — AB (ref 6–20)
CHLORIDE: 109 mmol/L (ref 101–111)
CO2: 23 mmol/L (ref 22–32)
CO2: 24 mmol/L (ref 22–32)
CREATININE: 1.49 mg/dL — AB (ref 0.44–1.00)
Calcium: 8.8 mg/dL — ABNORMAL LOW (ref 8.9–10.3)
Calcium: 9.3 mg/dL (ref 8.9–10.3)
Chloride: 109 mmol/L (ref 101–111)
Creatinine, Ser: 1.28 mg/dL — ABNORMAL HIGH (ref 0.44–1.00)
GFR calc Af Amer: 33 mL/min — ABNORMAL LOW (ref >60)
GFR calc Af Amer: 40 mL/min — ABNORMAL LOW (ref >60)
GFR calc non Af Amer: 29 mL/min — ABNORMAL LOW (ref >60)
GFR, EST NON AFRICAN AMERICAN: 35 mL/min — AB (ref >60)
GLUCOSE: 108 mg/dL — AB (ref 65–99)
GLUCOSE: 210 mg/dL — AB (ref 65–99)
POTASSIUM: 4.3 mmol/L (ref 3.5–5.1)
Potassium: 4.1 mmol/L (ref 3.5–5.1)
SODIUM: 140 mmol/L (ref 135–145)
Sodium: 141 mmol/L (ref 135–145)

## 2015-04-22 LAB — APTT: APTT: 32 s (ref 24–36)

## 2015-04-22 LAB — HEPARIN LEVEL (UNFRACTIONATED): Heparin Unfractionated: 0.36 IU/mL (ref 0.30–0.70)

## 2015-04-22 MED ORDER — OMEPRAZOLE 20 MG PO CPDR: 20.0000 mg | DELAYED_RELEASE_CAPSULE | Freq: Two times a day (BID) | ORAL | Status: AC

## 2015-04-22 MED ORDER — HYDROXYUREA 500 MG PO CAPS: 500.0000 mg | ORAL_CAPSULE | ORAL | Status: DC

## 2015-04-22 MED ORDER — ATORVASTATIN CALCIUM 20 MG PO TABS: 20.0000 mg | ORAL_TABLET | Freq: Every day | ORAL | Status: DC

## 2015-04-22 MED ORDER — HYDROXYUREA 500 MG PO CAPS: 500.0000 mg | ORAL_CAPSULE | Freq: Every day | ORAL | Status: DC

## 2015-04-22 MED ORDER — SODIUM CHLORIDE 0.9 % IJ SOLN
3.0000 mL | Freq: Two times a day (BID) | INTRAMUSCULAR | Status: DC
  Administered 2015-04-22 (×2): 3 mL via INTRAVENOUS

## 2015-04-22 MED ORDER — ACETAMINOPHEN 325 MG PO TABS: 650.0000 mg | ORAL_TABLET | Freq: Four times a day (QID) | ORAL | Status: DC | PRN

## 2015-04-22 MED ORDER — METOPROLOL SUCCINATE ER 50 MG PO TB24
50.0000 mg | ORAL_TABLET | Freq: Every day | ORAL | Status: DC
  Administered 2015-04-22: 50 mg via ORAL
  Filled 2015-04-22 (×2): qty 1

## 2015-04-22 MED ORDER — HEPARIN BOLUS VIA INFUSION
3300.0000 [IU] | Freq: Once | INTRAVENOUS | Status: AC
  Administered 2015-04-22: 3300 [IU] via INTRAVENOUS
  Filled 2015-04-22: qty 3300

## 2015-04-22 MED ORDER — ISOSORBIDE MONONITRATE ER 60 MG PO TB24: 60.0000 mg | ORAL_TABLET | Freq: Every day | ORAL | Status: DC

## 2015-04-22 MED ORDER — HEPARIN (PORCINE) IN NACL 100-0.45 UNIT/ML-% IJ SOLN
700.0000 [IU]/h | INTRAMUSCULAR | Status: DC
  Administered 2015-04-22 (×2): 700 [IU]/h via INTRAVENOUS
  Filled 2015-04-22 (×2): qty 250

## 2015-04-22 MED ORDER — ACETAMINOPHEN 650 MG RE SUPP: 650.0000 mg | Freq: Four times a day (QID) | RECTAL | Status: DC | PRN

## 2015-04-22 MED ORDER — IRBESARTAN 150 MG PO TABS: 150.0000 mg | ORAL_TABLET | Freq: Every day | ORAL | Status: DC

## 2015-04-22 MED ORDER — ISOSORBIDE MONONITRATE ER 60 MG PO TB24
60.0000 mg | ORAL_TABLET | Freq: Every day | ORAL | Status: DC
  Administered 2015-04-22: 60 mg via ORAL
  Filled 2015-04-22: qty 1

## 2015-04-22 MED ORDER — PANTOPRAZOLE SODIUM 40 MG IV SOLR
40.0000 mg | Freq: Two times a day (BID) | INTRAVENOUS | Status: DC
  Administered 2015-04-22 (×2): 40 mg via INTRAVENOUS
  Filled 2015-04-22 (×3): qty 40

## 2015-04-22 MED ORDER — HYDROXYUREA 500 MG PO CAPS: 1000.0000 mg | ORAL_CAPSULE | ORAL | Status: DC

## 2015-04-22 MED ORDER — HYDROXYUREA 500 MG PO CAPS
500.0000 mg | ORAL_CAPSULE | Freq: Every day | ORAL | Status: DC
  Administered 2015-04-22: 500 mg via ORAL
  Filled 2015-04-22: qty 1

## 2015-04-22 MED ORDER — ASPIRIN EC 81 MG PO TBEC
81.0000 mg | DELAYED_RELEASE_TABLET | Freq: Every day | ORAL | Status: DC
  Administered 2015-04-22: 81 mg via ORAL
  Filled 2015-04-22: qty 1

## 2015-04-22 NOTE — Progress Notes (Signed)
ANTICOAGULATION CONSULT NOTE - Follow-up Consult  Pharmacy Consult for heparin Indication: chest pain/ACS  Allergies  Allergen Reactions  . Alendronate Sodium Other (See Comments)  . Calcitonin   . Cephalexin Other (See Comments)  . Raloxifene Other (See Comments)  . Tramadol Other (See Comments)    Patient Measurements: Height: 5' 0.98" (154.9 cm) Weight: 120 lb 9.6 oz (54.704 kg) IBW/kg (Calculated) : 47.76 Heparin Dosing Weight: 55.3 kg  Vital Signs: Temp: 98.1 F (36.7 C) (08/22 0748) Temp Source: Oral (08/22 0748) BP: 130/45 mmHg (08/22 0748) Pulse Rate: 72 (08/22 0748)  Labs:  Recent Labs  04/21/15 2342 04/22/15 04/22/15 0644 04/22/15 0908  HGB 10.6*  --  9.7*  --   HCT 30.8*  --  28.8*  --   PLT 369  --  304  --   APTT  --  32  --   --   HEPARINUNFRC  --   --   --  0.36  CREATININE 1.49*  --   --   --   TROPONINI <0.03  --  0.06*  --     Estimated Creatinine Clearance: 17.4 mL/min (by C-G formula based on Cr of 1.49).   Medical History: Past Medical History  Diagnosis Date  . Anemia   . HTN (hypertension)   . TIA (transient ischemic attack)   . Diverticulosis   . GERD (gastroesophageal reflux disease)   . NSTEMI (non-ST elevated myocardial infarction)   . Osteoporosis   . CKD (chronic kidney disease)   . Thrombocytosis   . GI bleed   . CAD (coronary artery disease)     Medications:  Infusions:  . heparin 700 Units/hr (04/22/15 0051)    Assessment: 89 yof cc chest pain with cardiac history ED starting UFH while working up  Goal of Therapy:  Heparin level 0.3-0.7 units/ml Monitor platelets by anticoagulation protocol: Yes   Plan:  Heparin level =.36 Therapeutic. Continue same rate of 700 units/hr. Will check another level in 8 hours for confirmation. Pharmacy to continue to monitor CBC daily  Ramond Dial, Pharm.D. Clinical Pharmacist 04/22/2015,9:58 AM

## 2015-04-22 NOTE — Discharge Summary (Signed)
[facility] NAME: [name]    MR#:  [id number]  DATE OF BIRTH:  1919/05/29  DATE OF ADMISSION:  04/21/2015 ADMITTING PHYSICIAN: Lance Coon, MD  DATE OF DISCHARGE: 04/21/2015  PRIMARY CARE PHYSICIAN: Dion Body, MD    ADMISSION DIAGNOSIS:  NSTEMI (non-ST elevated myocardial infarction) [I21.4]  DISCHARGE DIAGNOSIS:  Principal Problem:   Unstable angina Active Problems:   HTN (hypertension)   GERD (gastroesophageal reflux disease)   CKD (chronic kidney disease), stage IV   Thrombocytosis   History of GI bleed   CAD (coronary artery disease)   SECONDARY DIAGNOSIS:   Past Medical History  Diagnosis Date  . Anemia   . HTN (hypertension)   . TIA (transient ischemic attack)   . Diverticulosis   . GERD (gastroesophageal reflux disease)   . NSTEMI (non-ST elevated myocardial infarction)   . Osteoporosis   . CKD (chronic kidney disease)   . Thrombocytosis   . GI bleed   . CAD (coronary artery disease)     HOSPITAL COURSE:   1. Chest pain - could be angina. Family and patient do not want any aggressive testing. I will get a third troponin and if that is okay are just borderline we will discharge home because she will do better at home. The Imdur was increased to 60 mg daily. Continue aspirin and Toprol and statin.  2. Hypertension essential continue usual medications   3. Gastroesophageal reflux disease without esophagitis on omeprazole   4. Thrombocytosis controlled with Hydrea 5. Anemia unspecified on iron 150    DISCHARGE CONDITIONS:   Satisfactory  CONSULTS OBTAINED:  Treatment Team:  Teodoro Spray, MD  DRUG ALLERGIES:   Allergies  Allergen Reactions  . Alendronate Sodium Other (See Comments)  . Calcitonin   . Cephalexin Other (See Comments)  . Raloxifene Other (See Comments)  . Tramadol Other (See Comments)    DISCHARGE MEDICATIONS:   Current Discharge Medication List     START taking these medications   Details  omeprazole (PRILOSEC) 20 MG capsule Take 1 capsule (20 mg total) by mouth 2 (two) times daily before a meal.      CONTINUE these medications which have CHANGED   Details  isosorbide mononitrate (IMDUR) 60 MG 24 hr tablet Take 1 tablet (60 mg total) by mouth daily. Qty: 30 tablet, Refills: 0      CONTINUE these medications which have NOT CHANGED   Details  acetaminophen (TYLENOL) 500 MG tablet Take 500 mg by mouth every 6 (six) hours as needed.    amLODipine (NORVASC) 5 MG tablet Take 5 mg by mouth daily.    aspirin 81 MG tablet Take 81 mg by mouth daily.    atorvastatin (LIPITOR) 10 MG tablet Take 10 mg by mouth daily.    Cyanocobalamin (B-12) 2500 MCG TABS Take 1 tablet by mouth every 30 (thirty) days.     furosemide (LASIX) 20 MG tablet Take 20 mg by mouth every other day.    hydroxyurea (HYDREA) 500 MG capsule Take 1 capsule by mouth daily.    iron polysaccharides (NIFEREX) 150 MG capsule Take 150 mg by mouth daily.    metoprolol succinate (TOPROL-XL) 50 MG 24 hr tablet Take 1 tablet by mouth daily.    potassium chloride SA (K-DUR,KLOR-CON) 20 MEQ tablet Take 20 mEq by mouth every other day.       STOP taking these medications     isosorbide dinitrate (ISORDIL) 30 MG  tablet      pantoprazole (PROTONIX) 40 MG tablet          DISCHARGE INSTRUCTIONS:    follow-up one week with Dr. Nehemiah Massed   If you experience worsening of your admission symptoms, develop shortness of breath, life threatening emergency, suicidal or homicidal thoughts you must seek medical attention immediately by calling 911 or calling your MD immediately  if symptoms less severe.  You Must read complete instructions/literature along with all the possible adverse reactions/side effects for all the Medicines you take and that have been prescribed to you. Take any new Medicines after you have completely understood and accept all the possible adverse  reactions/side effects.   Please note  You were cared for by a hospitalist during your hospital stay. If you have any questions about your discharge medications or the care you received while you were in the hospital after you are discharged, you can call the unit and asked to speak with the hospitalist on call if the hospitalist that took care of you is not available. Once you are discharged, your primary care physician will handle any further medical issues. Please note that NO REFILLS for any discharge medications will be authorized once you are discharged, as it is imperative that you return to your primary care physician (or establish a relationship with a primary care physician if you do not have one) for your aftercare needs so that they can reassess your need for medications and monitor your lab values.    Today   CHIEF COMPLAINT:   Chief Complaint  Patient presents with  . Chest Pain    HISTORY OF PRESENT ILLNESS:  [name]  is a 81 y.o. female with a known history of CAD presents with chest pain    VITAL SIGNS:  Blood pressure 138/50, pulse 66, temperature 98.6 F (37 C), temperature source Oral, resp. rate 20, height 5' 0.98" (1.549 m), weight 54.704 kg (120 lb 9.6 oz), SpO2 95 %.    PHYSICAL EXAMINATION:  GENERAL:  81 y.o.-year-old patient lying in the bed with no acute distress.  EYES: Pupils equal, round, reactive to light and accommodation. No scleral icterus. Extraocular muscles intact.  HEENT: Head atraumatic, normocephalic. Oropharynx and nasopharynx clear.  NECK:  Supple, no jugular venous distention. No thyroid enlargement, no tenderness.  LUNGS: Normal breath sounds bilaterally, no wheezing, rales,rhonchi or crepitation. No use of accessory muscles of respiration.  CARDIOVASCULAR: S1, S2 normal. No  murmurs, rubs, or gallops.  ABDOMEN: Soft, non-tender, non-distended. Bowel sounds present. No organomegaly or mass.  EXTREMITIES: No pedal edema, cyanosis, or  clubbing.  NEUROLOGIC: Cranial nerves II through XII are intact. Muscle strength 5/5 in all extremities. Sensation intact. Gait not checked.  PSYCHIATRIC: The patient is alert and oriented x 3.  SKIN: No obvious rash, lesion, or ulcer.   DATA REVIEW:   CBC  Recent Labs Lab 04/22/15 0644  WBC 6.4  HGB 9.7*  HCT 28.8*  PLT 304    Chemistries   Recent Labs Lab 04/21/15 2342  NA 140  K 4.1  CL 109  CO2 23  GLUCOSE 210*  BUN 32*  CREATININE 1.49*  CALCIUM 9.3    Cardiac Enzymes  Recent Labs Lab 04/22/15 0644  TROPONINI 0.06*    Microbiology Results  Results for orders placed or performed in visit on 09/06/12  Urine culture     Status: None   Collection Time: 09/06/12  9:15 AM  Result Value Ref Range Status   Micro  Text Report   Final       SOURCE: CLEAN CATCH    COMMENT                   NO GROWTH IN 18-24 HOURS   ANTIBIOTIC                                                        RADIOLOGY:  Dg Chest Port 1 View  04/22/2015   CLINICAL DATA:  Chest pain for 2 hours.  Dyspnea and tachypnea.  EXAM: PORTABLE CHEST - 1 VIEW  COMPARISON:  06/03/2014  FINDINGS: Heart size remains at the upper limits of normal. There is mild atherosclerosis of thoracic aorta. No confluent airspace disease, pulmonary edema, pleural effusion or pneumothorax. Old right rib fractures again seen.  IMPRESSION: No acute pulmonary process.   Electronically Signed   By: Jeb Levering M.D.   On: 04/22/2015 00:20   Management plans discussed with the patient, family and they are in agreement.  CODE STATUS:     Code Status Orders        Start     Ordered   04/22/15 0220  Do not attempt resuscitation (DNR)   Continuous    Question Answer Comment  In the event of cardiac or respiratory ARREST Do not call a "code blue"   In the event of cardiac or respiratory ARREST Do not perform Intubation, CPR, defibrillation or ACLS   In the event of cardiac or respiratory ARREST Use medication by  any route, position, wound care, and other measures to relive pain and suffering. May use oxygen, suction and manual treatment of airway obstruction as needed for comfort.      04/22/15 0219    Advance Directive Documentation        Most Recent Value   Type of Advance Directive  Out of facility DNR (pink MOST or yellow form)   Pre-existing out of facility DNR order (yellow form or pink MOST form)  Yellow form placed in chart (order not valid for inpatient use)   "MOST" Form in Place?        TOTAL TIME TAKING CARE OF THIS PATIENT: 35 minutes.    Loletha Grayer M.D on 04/22/2015 at 1:51 PM  Between 7am to 6pm - Pager - [phone]  After 6pm go to [email or web address] - password [email or web address] [facility]  Office  [phone]  CC: Primary care physician; Dion Body, MD

## 2015-04-22 NOTE — ED Provider Notes (Signed)
[facility] Emergency Department Provider Note  ____________________________________________  Time seen: 11:10 PM on arrival by EMS  I have reviewed the triage vital signs and the nursing notes.   HISTORY  Chief Complaint Chest Pain    HPI Elizabeth BOULTON is a 81 y.o. female who had a sudden onset of midsternal chest pain at about 10:00 PM tonight. Was associated with shortness of breath. It started while she was getting ready to go to bed, walking around the house and putting things away. He continued to get worse as she continued walking around the house until it was severe causing her to use her life alert button. She sat down on her bed and rested it did not seem to be improving. EMS arrived shortly thereafter gave her 2 nitroglycerin and Nitropaste which cause her pain to resolve. They also noted that she initially had ST depressions in V5 and V6 on their initial EKG. She is followed by Dr. Mabeline Caras and has had previous heart attack due to CAD.  The current pain was midsternal, nonradiating, associated with shortness of breath but no nausea vomiting dizziness syncope or diaphoresis or radiation. Aggravated by exertion, no alleviating factors. Feels similar to prior MI.  The patient arrives with a DO NOT RESUSCITATE form.   Past Medical History  Diagnosis Date  . Anemia   . HTN (hypertension)   . TIA (transient ischemic attack)   . Diverticulosis   . GERD (gastroesophageal reflux disease)   . NSTEMI (non-ST elevated myocardial infarction)   . Osteoporosis   . CKD (chronic kidney disease)   . Thrombocytosis     Patient Active Problem List   Diagnosis Date Noted  . Unstable angina 04/22/2015  . HTN (hypertension) 04/22/2015  . GERD (gastroesophageal reflux disease) 04/22/2015  . CKD (chronic kidney disease), stage IV 04/22/2015  . Thrombocytosis 04/22/2015  . Macrocytic anemia 03/27/2015    Past Surgical History  Procedure Laterality Date  .  Abdominal hysterectomy    . Colonoscopy      Current Outpatient Rx  Name  Route  Sig  Dispense  Refill  . hydroxyurea (HYDREA) 500 MG capsule   Oral   Take 1 capsule by mouth daily.         . metoprolol succinate (TOPROL-XL) 50 MG 24 hr tablet   Oral   Take 1 tablet by mouth daily.         Marland Kitchen omeprazole (PRILOSEC) 20 MG capsule   Oral   Take 1 capsule by mouth 2 (two) times daily.         . pantoprazole (PROTONIX) 40 MG tablet   Oral   Take 40 mg by mouth daily.         . potassium chloride SA (K-DUR,KLOR-CON) 20 MEQ tablet   Oral   Take 0.5 tablets by mouth daily.         . valsartan (DIOVAN) 160 MG tablet   Oral   Take 160 mg by mouth daily.         . hydroxyurea (HYDREA) 500 MG capsule      TAKE 2 CAPSULES BY MOUTH ON FRIDAYS AND 1 CAPSULE ALL OTHER DAYS OF WEEK   102 capsule   0     Allergies Alendronate sodium; Calcitonin; Cephalexin; Raloxifene; and Tramadol  Family History  Problem Relation Age of Onset  . Stroke Father   . Cancer Mother     Social History Social History  Substance Use Topics  . Smoking  status: Never Smoker   . Smokeless tobacco: Never Used  . Alcohol Use: No    Review of Systems  Constitutional: No fever or chills. No weight changes Eyes:No blurry vision or double vision.  ENT: No sore throat. Cardiovascular: Positive chest pain as above Respiratory: Shortness of breath as above  Gastrointestinal: Negative for abdominal pain, vomiting and diarrhea.  No BRBPR or melena. Genitourinary: Negative for dysuria, urinary retention, bloody urine, or difficulty urinating. Musculoskeletal: Negative for back pain. No joint swelling or pain. Skin: Negative for rash. Neurological: Negative for headaches, focal weakness or numbness. Psychiatric:No anxiety or depression.   Endocrine:No hot/cold intolerance, changes in energy, or sleep difficulty.  10-point ROS otherwise  negative.  ____________________________________________   PHYSICAL EXAM:  VITAL SIGNS: ED Triage Vitals  Enc Vitals Group     BP 04/21/15 2320 157/59 mmHg     Pulse Rate 04/21/15 2320 93     Resp 04/21/15 2320 28     Temp 04/21/15 2320 98.5 F (36.9 C)     Temp Source 04/21/15 2320 Oral     SpO2 04/21/15 2320 96 %     Weight 04/21/15 2329 122 lb (55.339 kg)     Height 04/21/15 2329 5' 0.98" (1.549 m)     Head Cir --      Peak Flow --      Pain Score --      Pain Loc --      Pain Edu? --      Excl. in [place]? --      Constitutional: Alert and oriented. Well appearing and in no distress. Eyes: No scleral icterus. No conjunctival pallor. PERRL. EOMI ENT   Head: Normocephalic and atraumatic.   Nose: No congestion/rhinnorhea. No septal hematoma   Mouth/Throat: MMM, no pharyngeal erythema. No peritonsillar mass. No uvula shift.   Neck: No stridor. No SubQ emphysema. No meningismus. Hematological/Lymphatic/Immunilogical: No cervical lymphadenopathy. Cardiovascular: RRR. Normal and symmetric distal pulses are present in all extremities. No murmurs, rubs, or gallops. Respiratory: Normal respiratory effort without tachypnea nor retractions. Breath sounds are clear and equal bilaterally. No wheezes/rales/rhonchi. Gastrointestinal: Soft and nontender. No distention. There is no CVA tenderness.  No rebound, rigidity, or guarding. Genitourinary: deferred Musculoskeletal: Nontender with normal range of motion in all extremities. No joint effusions.  No lower extremity tenderness.  No edema. Neurologic:   Normal speech and language.  CN 2-10 normal. Motor grossly intact. No pronator drift.  Normal gait. No gross focal neurologic deficits are appreciated.  Skin:  Skin is warm, dry and intact. No rash noted.  No petechiae, purpura, or bullae. Psychiatric: Mood and affect are normal. Speech and behavior are normal. Patient exhibits appropriate insight and  judgment.  ____________________________________________    LABS (pertinent positives/negatives) (all labs ordered are listed, but only abnormal results are displayed) Labs Reviewed  BASIC METABOLIC PANEL - Abnormal; Notable for the following:    Glucose, Bld 210 (*)    BUN 32 (*)    Creatinine, Ser 1.49 (*)    GFR calc non Af Amer 29 (*)    GFR calc Af Amer 33 (*)    All other components within normal limits  CBC WITH DIFFERENTIAL/PLATELET - Abnormal; Notable for the following:    RBC 2.67 (*)    Hemoglobin 10.6 (*)    HCT 30.8 (*)    MCV 115.5 (*)    MCH 39.6 (*)    Neutro Abs 7.2 (*)    Lymphs Abs 0.4 (*)  All other components within normal limits  TROPONIN I  APTT  HEPARIN LEVEL (UNFRACTIONATED)   ____________________________________________   EKG  Interpreted by me Normal sinus rhythm rate of 93, normal axis, first-degree AV block, normal QRS. There is 1 mm ST depression in V5 and V6, normal T waves.  ____________________________________________    RADIOLOGY  Chest x-ray unremarkable  ____________________________________________   PROCEDURES CRITICAL CARE Performed by: Joni Fears, Tameaka Eichhorn   Total critical care time: 35 minutes  Critical care time was exclusive of separately billable procedures and treating other patients.  Critical care was necessary to treat or prevent imminent or life-threatening deterioration.  Critical care was time spent personally by me on the following activities: development of treatment plan with patient and/or surrogate as well as nursing, discussions with consultants, evaluation of patient's response to treatment, examination of patient, obtaining history from patient or surrogate, ordering and performing treatments and interventions, ordering and review of laboratory studies, ordering and review of radiographic studies, pulse oximetry and re-evaluation of patient's  condition.  ____________________________________________   INITIAL IMPRESSION / ASSESSMENT AND PLAN / ED COURSE  Pertinent labs & imaging results that were available during my care of the patient were reviewed by me and considered in my medical decision making (see chart for details).  Patient presents with symptoms of ACS. This is for further corroborated by ST depression on EKG. In this 81 year old patient with known heart disease, we will treat her as an NSTEMI and start heparin.  ----------------------------------------- 12:43 AM on 04/22/2015 -----------------------------------------  Initial labs unremarkable. Patient has stable CK D. Case discussed with the hospitalist will determine further management.  ____________________________________________   FINAL CLINICAL IMPRESSION(S) / ED DIAGNOSES  Final diagnoses:  NSTEMI (non-ST elevated myocardial infarction)      Carrie Mew, MD 04/22/15 [phone]

## 2015-04-22 NOTE — Progress Notes (Signed)
Pt to CVT via bed

## 2015-04-22 NOTE — Progress Notes (Signed)
Assessment completed.  Heparin gtt infusing at 50ml/hr rt ac.  No distress on ra.  Cardiac monitor in place pt denies chest pain.  Remains npo for possible testing this am. Son at bedside.  Denies need. CB in reach, SR up x 2.

## 2015-04-22 NOTE — Progress Notes (Signed)
Back from CVT

## 2015-04-22 NOTE — Progress Notes (Signed)
ANTICOAGULATION CONSULT NOTE - Initial Consult  Pharmacy Consult for heparin Indication: chest pain/ACS  Allergies  Allergen Reactions  . Alendronate Sodium Other (See Comments)  . Cephalexin Other (See Comments)  . Raloxifene Other (See Comments)  . Tramadol Other (See Comments)    Patient Measurements: Height: 5' 0.98" (154.9 cm) Weight: 122 lb (55.339 kg) IBW/kg (Calculated) : 47.76 Heparin Dosing Weight: 55.3 kg  Vital Signs: Temp: 98.5 F (36.9 C) (08/21 2320) Temp Source: Oral (08/21 2320) BP: 157/59 mmHg (08/21 2320) Pulse Rate: 93 (08/21 2320)  Labs:  Recent Labs  04/21/15 2342  HGB 10.6*  HCT 30.8*  PLT 369    CrCl cannot be calculated (Patient has no serum creatinine result on file.).   Medical History: History reviewed. No pertinent past medical history.  Medications:  Infusions:  . heparin      Assessment: 89 yof cc chest pain with cardiac history ED starting UFH while working up  Goal of Therapy:  Heparin level 0.3-0.7 units/ml Monitor platelets by anticoagulation protocol: Yes   Plan:  Give 3300 units bolus x 1 Start heparin infusion at 700 units/hr Check anti-Xa level in 8 hours and daily while on heparin Continue to monitor H&H and platelets  Laural Benes, Pharm.D. Clinical Pharmacist 04/22/2015,12:07 AM

## 2015-04-22 NOTE — Progress Notes (Signed)
*  PRELIMINARY RESULTS* Echocardiogram 2D Echocardiogram has been performed.  Laqueta Jean Hege 04/22/2015, 10:51 AM

## 2015-04-22 NOTE — Progress Notes (Signed)
Pt discharged to home via wc.  Instructions and rx given to pt.  Questions answered.  No distress.  

## 2015-04-22 NOTE — Care Management (Signed)
Admitted under observation from home with chest pain.  Thus far highest troponin is .06.  Cardiology consult pending

## 2015-04-22 NOTE — H&P (Addendum)
[facility] [place] NAME: Unnamed Forsyth    MR#:  [id number]  DATE OF BIRTH:  30-Sep-1918  DATE OF ADMISSION:  04/22/2015  PRIMARY CARE PHYSICIAN: Dion Body, MD   REQUESTING/REFERRING PHYSICIAN:  Joni Fears, M.D.  CHIEF COMPLAINT:   Chief Complaint  Patient presents with  . Chest Pain    HISTORY OF PRESENT ILLNESS:  [name]  is a 81 y.o. female who presents with  Unstable angina. Patient states that around 10:00 the evening prior to admission she was getting ready for bed and developed central chest pain that was pressure-like , nonradiating , associated with mild shortness of breath , not associated with diaphoresis, nausea or vomiting. The patient laid down in bed hoping that they will go away, but the pain persisted. She took 2 sublingual nitros that she had on hand, with no benefit. She called EMS, who placed Nitropaste on her with symptom resolution. EKG per EMS showed V5 and V6 ST depressions , which improved after the Nitropaste on repeat EKG in the ED. Per the patient's son, who accompanies her in the ED today, patient has some prior diagnosis known blockage in her coronary arteries, but had refused intervention. Her son does not remember where the blockage is. First set of cardiac enzymes in the ED was negative. Labs otherwise largely stable. Hospitalists were called for admission for unstable angina. Patient was started on heparin drip in the ED.  PAST MEDICAL HISTORY:   Past Medical History  Diagnosis Date  . Anemia   . HTN (hypertension)   . TIA (transient ischemic attack)   . Diverticulosis   . GERD (gastroesophageal reflux disease)   . NSTEMI (non-ST elevated myocardial infarction)   . Osteoporosis   . CKD (chronic kidney disease)   . Thrombocytosis   . GI bleed     PAST SURGICAL HISTORY:   Past Surgical History  Procedure Laterality Date  . Abdominal hysterectomy    . Colonoscopy      SOCIAL HISTORY:    Social History  Substance Use Topics  . Smoking status: Never Smoker   . Smokeless tobacco: Never Used  . Alcohol Use: No    FAMILY HISTORY:   Family History  Problem Relation Age of Onset  . Stroke Father   . Cancer Mother     DRUG ALLERGIES:   Allergies  Allergen Reactions  . Alendronate Sodium Other (See Comments)  . Calcitonin   . Cephalexin Other (See Comments)  . Raloxifene Other (See Comments)  . Tramadol Other (See Comments)    MEDICATIONS AT HOME:   Prior to Admission medications   Medication Sig Start Date End Date Taking? Authorizing Provider  acetaminophen (TYLENOL) 500 MG tablet Take 500 mg by mouth every 6 (six) hours as needed.   Yes Historical Provider, MD  amLODipine (NORVASC) 5 MG tablet Take 5 mg by mouth daily.   Yes Historical Provider, MD  aspirin 81 MG tablet Take 81 mg by mouth daily.   Yes Historical Provider, MD  atorvastatin (LIPITOR) 10 MG tablet Take 10 mg by mouth daily.   Yes Historical Provider, MD  Cyanocobalamin (B-12) 2500 MCG TABS Take 1 tablet by mouth daily.   Yes Historical Provider, MD  furosemide (LASIX) 20 MG tablet Take 20 mg by mouth every other day.   Yes Historical Provider, MD  hydroxyurea (HYDREA) 500 MG capsule Take 1 capsule by mouth daily.   Yes Historical Provider, MD  iron polysaccharides (  NIFEREX) 150 MG capsule Take 150 mg by mouth daily.   Yes Historical Provider, MD  isosorbide dinitrate (ISORDIL) 30 MG tablet Take 30 mg by mouth daily.   Yes Historical Provider, MD  isosorbide mononitrate (IMDUR) 30 MG 24 hr tablet Take 30 mg by mouth daily.   Yes Historical Provider, MD  metoprolol succinate (TOPROL-XL) 50 MG 24 hr tablet Take 1 tablet by mouth daily. 01/29/15  Yes Historical Provider, MD  pantoprazole (PROTONIX) 40 MG tablet Take 40 mg by mouth daily.   Yes Historical Provider, MD  potassium chloride SA (K-DUR,KLOR-CON) 20 MEQ tablet Take 0.5 tablets by mouth daily. 03/18/15  Yes Historical Provider, MD   valsartan (DIOVAN) 160 MG tablet Take 160 mg by mouth daily.   Yes Historical Provider, MD    REVIEW OF SYSTEMS:  Review of Systems  Constitutional: Negative for fever, chills, weight loss and malaise/fatigue.  HENT: Negative for ear pain, hearing loss and tinnitus.   Eyes: Negative for blurred vision, double vision, pain and redness.  Respiratory: Positive for shortness of breath. Negative for cough and hemoptysis.   Cardiovascular: Positive for chest pain. Negative for palpitations, orthopnea and leg swelling.  Gastrointestinal: Negative for nausea, vomiting, abdominal pain, diarrhea and constipation.  Genitourinary: Negative for dysuria, frequency and hematuria.  Musculoskeletal: Negative for back pain, joint pain and neck pain.  Skin:       No acne, rash, or lesions  Neurological: Negative for dizziness, tremors, focal weakness and weakness.  Endo/Heme/Allergies: Negative for polydipsia. Does not bruise/bleed easily.  Psychiatric/Behavioral: Negative for depression. The patient is not nervous/anxious and does not have insomnia.      VITAL SIGNS:   Filed Vitals:   04/22/15 0015 04/22/15 0030 04/22/15 0045 04/22/15 0047  BP:  130/46    Pulse: 82 83 82 79  Temp:      TempSrc:      Resp: 23 26 22 23   Height:      Weight:      SpO2: 95% 96% 96% 99%   Wt Readings from Last 3 Encounters:  04/21/15 55.339 kg (122 lb)    PHYSICAL EXAMINATION:  Physical Exam  Vitals reviewed. Constitutional: She is oriented to person, place, and time. She appears well-developed and well-nourished. No distress.  HENT:  Head: Normocephalic and atraumatic.  Mouth/Throat: Oropharynx is clear and moist.  Eyes: Conjunctivae and EOM are normal. Pupils are equal, round, and reactive to light. No scleral icterus.  Neck: Normal range of motion. Neck supple. No JVD present. No thyromegaly present.  Cardiovascular: Normal rate, regular rhythm and intact distal pulses.  Exam reveals no gallop and no  friction rub.   No murmur heard. Respiratory: Effort normal and breath sounds normal. No respiratory distress. She has no wheezes. She has no rales.  GI: Soft. Bowel sounds are normal. She exhibits no distension. There is no tenderness.  Musculoskeletal: Normal range of motion. She exhibits no edema.  No arthritis, no gout  Lymphadenopathy:    She has no cervical adenopathy.  Neurological: She is alert and oriented to person, place, and time. No cranial nerve deficit.  No dysarthria, no aphasia  Skin: Skin is warm and dry. No rash noted. No erythema.  Psychiatric: She has a normal mood and affect. Her behavior is normal. Judgment and thought content normal.    LABORATORY PANEL:   CBC  Recent Labs Lab 04/21/15 2342  WBC 8.2  HGB 10.6*  HCT 30.8*  PLT 369   ------------------------------------------------------------------------------------------------------------------  Chemistries  Recent Labs Lab 04/21/15 2342  NA 140  K 4.1  CL 109  CO2 23  GLUCOSE 210*  BUN 32*  CREATININE 1.49*  CALCIUM 9.3   ------------------------------------------------------------------------------------------------------------------  Cardiac Enzymes  Recent Labs Lab 04/21/15 2342  TROPONINI <0.03   ------------------------------------------------------------------------------------------------------------------  RADIOLOGY:  Dg Chest Port 1 View  04/22/2015   CLINICAL DATA:  Chest pain for 2 hours.  Dyspnea and tachypnea.  EXAM: PORTABLE CHEST - 1 VIEW  COMPARISON:  06/03/2014  FINDINGS: Heart size remains at the upper limits of normal. There is mild atherosclerosis of thoracic aorta. No confluent airspace disease, pulmonary edema, pleural effusion or pneumothorax. Old right rib fractures again seen.  IMPRESSION: No acute pulmonary process.   Electronically Signed   By: Jeb Levering M.D.   On: 04/22/2015 00:20    EKG:   Orders placed or performed during the hospital encounter  of 04/21/15  . ED EKG  . ED EKG    IMPRESSION AND PLAN:  Principal Problem:   Unstable angina -  With known CAD. Patient's ST depressions improved after nitro paste. There were initially in V5 and V6. Patient's symptoms also improved Nitropaste. She is hemodynamically stable. First set of cardiac enzymes is negative. We'll admit her with telemetry, trend her cardiac enzymes, order echocardiogram, get a cardiology consult. She was placed on a heparin drip by ED physician. Active Problems:   Coronary artery disease -  With some known blockage per patient's son's report. Unknown where this blockage may been. We'll defer to cardiology for her cardiac history and further recommendations. Continue home medications for this at this time.   HTN (hypertension) -  Continue home meds   History of GI bleed -  We will use IV twice a day PPI while patient is on full anticoagulation. Her son states that her GI bleed with significant last time that she acquired anticoagulation.   GERD (gastroesophageal reflux disease) -  PPI as above   CKD (chronic kidney disease), stage IV -  Avoid nephrotoxins, renally dose medications , monitor creatinine level.   Thrombocytosis -  Continue the patient's hydroxyurea  All the records are reviewed and case discussed with ED provider. Management plans discussed with the patient and/or family.  DVT PROPHYLAXIS: systemic anticoagulation  ADMISSION STATUS:  observation  CODE STATUS:  DO NOT RESUSCITATE Advance Directive Documentation        Most Recent Value   Type of Advance Directive  Out of facility DNR (pink MOST or yellow form)   Pre-existing out of facility DNR order (yellow form or pink MOST form)  Yellow form placed in chart (order not valid for inpatient use)   "MOST" Form in Place?        TOTAL TIME TAKING CARE OF THIS PATIENT:  40 minutes.    [name] 04/22/2015, 12:56 AM  [facility]  Office  [phone]  CC: Primary  care physician; Dion Body, MD

## 2015-04-22 NOTE — Discharge Instructions (Signed)
Acute Coronary Syndrome  Acute coronary syndrome (ACS) is an urgent problem in which the blood and oxygen supply to the heart is critically deficient. ACS requires hospitalization because one or more coronary arteries may be blocked.  ACS represents a range of conditions including:  · Previous angina that is now unstable, lasts longer, happens at rest, or is more intense.  · A heart attack, with heart muscle cell injury and death.  There are three vital coronary arteries that supply the heart muscle with blood and oxygen so that it can pump blood effectively. If blockages to these arteries develop, blood flow to the heart muscle is reduced. If the heart does not get enough blood, angina may occur as the first warning sign.  SYMPTOMS   · The most common signs of angina include:  ¨ Tightness or squeezing in the chest.  ¨ Feeling of heaviness on the chest.  ¨ Discomfort in the arms, neck, back, or jaw.  ¨ Shortness of breath and nausea.  ¨ Cold, wet skin.  · Angina is usually brought on by physical effort or excitement which increase the oxygen needs of the heart. These states increase the blood flow needs of the heart beyond what can be delivered.  · Other symptoms that are not as common include:  ¨ Fatigue  ¨ Unexplained feelings of nervousness or anxiety  ¨ Weakness  ¨ Diarrhea  · Sometimes, you may not have noticed any symptoms at all but still suffered a cardiac injury.  TREATMENT   · Medicines to help discomfort may include nitroglycerin (nitro) in the form of tablets or a spray for rapid relief, or longer-acting forms such as cream, patches, or capsules. (Be aware that there are many side effects and possible interactions with other drugs).  · Other medicines may be used to help the heart pump better.  · Procedures to open blocked arteries including angioplasty or stent placement to keep the arteries open.  · Open heart surgery may be needed when there are many blockages or they are in critical locations that  are best treated with surgery.  HOME CARE INSTRUCTIONS   · Do not use any tobacco products including cigarettes, chewing tobacco, or electronic cigarettes.  · Take one baby or adult aspirin daily, if your health care provider advises. This helps reduce the risk of a heart attack.  · It is very important that you follow the angina treatment prescribed by your health care provider. Make arrangements for proper follow-up care.  · Eat a heart healthy diet with salt and fat restrictions as advised.  · Regular exercise is good for you as long as it does not cause discomfort. Do not begin any new type of exercise until you check with your health care provider.  · If you are overweight, you should lose weight.  · Try to maintain normal blood lipid levels.  · Keep your blood pressure under control as recommended by your health care provider.  · You should tell your health care provider right away about any increase in the severity or frequency of your chest discomfort or angina attacks. When you have angina, you should stop what you are doing and sit down. This may bring relief in 3 to 5 minutes. If your health care provider has prescribed nitro, take it as directed.  · If your health care provider has given you a follow-up appointment, it is very important to keep that appointment. Not keeping the appointment could result in a chronic or   permanent injury, pain, and disability. If there is any problem keeping the appointment, you must call back to this facility for assistance.  SEEK IMMEDIATE MEDICAL CARE IF:   · You develop nausea, vomiting, or shortness of breath.  · You feel faint, lightheaded, or pass out.  · Your chest discomfort gets worse.  · You are sweating or experience sudden profound fatigue.  · You do not get relief of your chest pain after 3 doses of nitro.  · Your discomfort lasts longer than 15 minutes.  MAKE SURE YOU:   · Understand these instructions.  · Will watch your condition.  · Will get help right  away if you are not doing well or get worse.  · Take all medicines as directed by your health care provider.  Document Released: 08/17/2005 Document Revised: 08/22/2013 Document Reviewed: 12/19/2013  Exit[employer]® Patient Information ©2015 [employer]. This information is not intended to replace advice given to you by your health care provider. Make sure you discuss any questions you have with your health care provider.

## 2015-04-22 NOTE — Consult Note (Signed)
[place]  CARDIOLOGY CONSULT NOTE  Patient ID: [name] MRN: [id number] DOB/AGE: 1919-03-22 81 y.o.  Admit date: 04/21/2015 Referring Physician weiting Primary Physician   Primary Cardiologist [facility] Reason for Consultation chest  HPI: Pt is a 81 yo female with history of cad s/p nstemi in the remote past who was admitted with midsternal chest pain. SHe had no acute changes on her ekg. Troponins have been trivially elevated. She is currently pain free and stable. She was on asa, imdur 30 daily, metoprolol 50. She was placed on heparin at admission. She had a gi bleed a nmber of hears ago requiring transfusion.    ROS Review of Systems - General ROS: positive for  - fatigue Respiratory ROS: no cough, shortness of breath, or wheezing Cardiovascular ROS: positive for - chest pain Gastrointestinal ROS: no abdominal pain, change in bowel habits, or black or bloody stools Musculoskeletal ROS: negative Neurological ROS: no TIA or stroke symptoms   Past Medical History  Diagnosis Date  . Anemia   . HTN (hypertension)   . TIA (transient ischemic attack)   . Diverticulosis   . GERD (gastroesophageal reflux disease)   . NSTEMI (non-ST elevated myocardial infarction)   . Osteoporosis   . CKD (chronic kidney disease)   . Thrombocytosis   . GI bleed   . CAD (coronary artery disease)     Family History  Problem Relation Age of Onset  . Stroke Father   . Cancer Mother     Social History   Social History  . Marital Status: Widowed    Spouse Name: N/A  . Number of Children: N/A  . Years of Education: N/A   Occupational History  . Not on file.   Social History Main Topics  . Smoking status: Never Smoker   . Smokeless tobacco: Never Used  . Alcohol Use: No  . Drug Use: No  . Sexual Activity: No   Other Topics Concern  . Not on file   Social History Narrative    Past Surgical History  Procedure Laterality Date  . Abdominal  hysterectomy    . Colonoscopy       Prescriptions prior to admission  Medication Sig Dispense Refill Last Dose  . acetaminophen (TYLENOL) 500 MG tablet Take 500 mg by mouth every 6 (six) hours as needed.   Past Week at Unknown time  . amLODipine (NORVASC) 5 MG tablet Take 5 mg by mouth daily.   04/21/2015 at Unknown time  . aspirin 81 MG tablet Take 81 mg by mouth daily.   04/21/2015 at Unknown time  . atorvastatin (LIPITOR) 10 MG tablet Take 10 mg by mouth daily.   Past Week at Unknown time  . Cyanocobalamin (B-12) 2500 MCG TABS Take 1 tablet by mouth every 30 (thirty) days.    04/21/2015 at Unknown time  . furosemide (LASIX) 20 MG tablet Take 20 mg by mouth every other day.   Past Week at Unknown time  . hydroxyurea (HYDREA) 500 MG capsule Take 1 capsule by mouth daily.   04/21/2015 at Unknown time  . iron polysaccharides (NIFEREX) 150 MG capsule Take 150 mg by mouth daily.   04/21/2015 at Unknown time  . isosorbide dinitrate (ISORDIL) 30 MG tablet Take 30 mg by mouth daily.   04/21/2015 at Unknown time  . isosorbide mononitrate (IMDUR) 30 MG 24 hr tablet Take 30 mg by mouth daily.   04/21/2015 at Unknown time  . metoprolol succinate (TOPROL-XL)  50 MG 24 hr tablet Take 1 tablet by mouth daily.   04/21/2015 at Unknown time  . potassium chloride SA (K-DUR,KLOR-CON) 20 MEQ tablet Take 20 mEq by mouth every other day.    04/21/2015 at Unknown time  . pantoprazole (PROTONIX) 40 MG tablet Take 40 mg by mouth daily.       Physical Exam: Blood pressure 138/50, pulse 66, temperature 98.6 F (37 C), temperature source Oral, resp. rate 20, height 5' 0.98" (1.549 m), weight 54.704 kg (120 lb 9.6 oz), SpO2 95 %.   General appearance: cooperative Resp: clear to auscultation bilaterally Cardio: regular rate and rhythm GI: soft, non-tender; bowel sounds normal; no masses,  no organomegaly Extremities: extremities normal, atraumatic, no cyanosis or edema Neurologic: Grossly normal Labs:   Lab Results   Component Value Date   WBC 6.4 04/22/2015   HGB 9.7* 04/22/2015   HCT 28.8* 04/22/2015   MCV 115.2* 04/22/2015   PLT 304 04/22/2015    Recent Labs Lab 04/21/15 2342  NA 140  K 4.1  CL 109  CO2 23  BUN 32*  CREATININE 1.49*  CALCIUM 9.3  GLUCOSE 210*   Lab Results  Component Value Date   TROPONINI 0.06* 04/22/2015      Radiology: no acute process EKG: nsr with rbbb. No ischmia  ASSESSMENT AND PLAN:  Chest pain with both typical and atypical chest pain. Has ruled out for mi thus far. EKG unremakable. Discussed with patient and family. Defer aggressive testing. First 2 troponin were normal. Will await third. Continue asa, toprol and statin. Increase imdur to 60 daily. Follow up Dr. Nehemiah Massed as outpatint.  Signed: Teodoro Spray MD, [facility] 04/22/2015, 1:43 PM

## 2015-05-09 ENCOUNTER — Ambulatory Visit: Payer: Self-pay | Admitting: Internal Medicine

## 2015-05-09 ENCOUNTER — Inpatient Hospital Stay: Payer: Medicare Other

## 2015-05-16 ENCOUNTER — Inpatient Hospital Stay (HOSPITAL_BASED_OUTPATIENT_CLINIC_OR_DEPARTMENT_OTHER): Payer: Medicare Other | Admitting: Family Medicine

## 2015-05-16 ENCOUNTER — Encounter: Payer: Self-pay | Admitting: Family Medicine

## 2015-05-16 ENCOUNTER — Inpatient Hospital Stay: Payer: Medicare Other | Attending: Family Medicine

## 2015-05-16 VITALS — BP 136/74 | HR 66 | Temp 97.2°F | Wt 121.0 lb

## 2015-05-16 DIAGNOSIS — Z8673 Personal history of transient ischemic attack (TIA), and cerebral infarction without residual deficits: Secondary | ICD-10-CM | POA: Diagnosis not present

## 2015-05-16 DIAGNOSIS — I2510 Atherosclerotic heart disease of native coronary artery without angina pectoris: Secondary | ICD-10-CM | POA: Diagnosis not present

## 2015-05-16 DIAGNOSIS — K219 Gastro-esophageal reflux disease without esophagitis: Secondary | ICD-10-CM | POA: Insufficient documentation

## 2015-05-16 DIAGNOSIS — D539 Nutritional anemia, unspecified: Secondary | ICD-10-CM

## 2015-05-16 DIAGNOSIS — I129 Hypertensive chronic kidney disease with stage 1 through stage 4 chronic kidney disease, or unspecified chronic kidney disease: Secondary | ICD-10-CM | POA: Insufficient documentation

## 2015-05-16 DIAGNOSIS — N189 Chronic kidney disease, unspecified: Secondary | ICD-10-CM | POA: Diagnosis not present

## 2015-05-16 DIAGNOSIS — D649 Anemia, unspecified: Secondary | ICD-10-CM

## 2015-05-16 DIAGNOSIS — Q2733 Arteriovenous malformation of digestive system vessel: Secondary | ICD-10-CM | POA: Diagnosis not present

## 2015-05-16 DIAGNOSIS — D473 Essential (hemorrhagic) thrombocythemia: Secondary | ICD-10-CM | POA: Insufficient documentation

## 2015-05-16 DIAGNOSIS — Z79899 Other long term (current) drug therapy: Secondary | ICD-10-CM

## 2015-05-16 DIAGNOSIS — I252 Old myocardial infarction: Secondary | ICD-10-CM | POA: Insufficient documentation

## 2015-05-16 HISTORY — DX: Essential (hemorrhagic) thrombocythemia: D47.3

## 2015-05-16 LAB — CBC WITH DIFFERENTIAL/PLATELET
BASOS ABS: 0 10*3/uL (ref 0–0.1)
BASOS PCT: 1 %
Eosinophils Absolute: 0.2 10*3/uL (ref 0–0.7)
Eosinophils Relative: 3 %
HEMATOCRIT: 29.7 % — AB (ref 35.0–47.0)
Hemoglobin: 10.3 g/dL — ABNORMAL LOW (ref 12.0–16.0)
LYMPHS PCT: 18 %
Lymphs Abs: 0.9 10*3/uL — ABNORMAL LOW (ref 1.0–3.6)
MCH: 38.8 pg — ABNORMAL HIGH (ref 26.0–34.0)
MCHC: 34.6 g/dL (ref 32.0–36.0)
MCV: 112.1 fL — AB (ref 80.0–100.0)
MONO ABS: 0.5 10*3/uL (ref 0.2–0.9)
Monocytes Relative: 10 %
NEUTROS ABS: 3.5 10*3/uL (ref 1.4–6.5)
NEUTROS PCT: 68 %
Platelets: 450 10*3/uL — ABNORMAL HIGH (ref 150–440)
RBC: 2.65 MIL/uL — AB (ref 3.80–5.20)
RDW: 12.6 % (ref 11.5–14.5)
WBC: 5 10*3/uL (ref 3.6–11.0)

## 2015-05-16 NOTE — Progress Notes (Signed)
[place]  [phone]  [phone]     [name] DOB: 09/12/1918  MR#: [id number]  [id number]  Patient Care Team: Dion Body, MD as PCP - General (Family Medicine)  CHIEF COMPLAINT:  Chief Complaint  Patient presents with  . Anemia    follow up   Essential thrombocytosis.  Work-up includes chest x-ray and abdominal ultrasound.  Iron studies  ruled out iron deficiency and polycythemia. BCR for CML is negative times 2 with the most recent done in November 2004. INTERVAL HISTORY:  Patient is here for continued follow-up and treatment consideration regarding essential thrombocytosis. She is currently taking Hydrea 500 mg once daily every day. She reports overall feeling very well for 94. Patient was previously followed by Dr. Inez Pilgrim. She denies any shortness of breath, chest pain, cough, lower extremity edema, diarrhea or constipation, no dizziness or change in mental status.  REVIEW OF SYSTEMS:   Review of Systems  Constitutional: Negative for fever, chills, weight loss, malaise/fatigue and diaphoresis.  HENT: Negative for congestion, ear discharge, ear pain, hearing loss, nosebleeds, sore throat and tinnitus.   Eyes: Negative for blurred vision, double vision, photophobia, pain, discharge and redness.  Respiratory: Negative for cough, hemoptysis, sputum production, shortness of breath, wheezing and stridor.   Cardiovascular: Negative for chest pain, palpitations, orthopnea, claudication, leg swelling and PND.  Gastrointestinal: Negative for heartburn, nausea, vomiting, abdominal pain, diarrhea, constipation, blood in stool and melena.  Genitourinary: Negative.   Musculoskeletal: Negative.   Skin: Negative.   Neurological: Negative for dizziness, tingling, focal weakness, seizures, weakness and headaches.  Endo/Heme/Allergies: Does not bruise/bleed easily.  Psychiatric/Behavioral: Negative for depression. The patient is not  nervous/anxious and does not have insomnia.     As per HPI. Otherwise, a complete review of systems is negatve.  ONCOLOGY HISTORY:  No history exists.    PAST MEDICAL HISTORY: Past Medical History  Diagnosis Date  . Anemia   . HTN (hypertension)   . TIA (transient ischemic attack)   . Diverticulosis   . GERD (gastroesophageal reflux disease)   . NSTEMI (non-ST elevated myocardial infarction)   . Osteoporosis   . CKD (chronic kidney disease)   . Thrombocytosis   . GI bleed   . CAD (coronary artery disease)   . Essential thrombocytosis 05/16/2015    PAST SURGICAL HISTORY: Past Surgical History  Procedure Laterality Date  . Abdominal hysterectomy    . Colonoscopy      FAMILY HISTORY Family History  Problem Relation Age of Onset  . Stroke Father   . Cancer Mother     GYNECOLOGIC HISTORY:  No LMP recorded. Patient has had a hysterectomy.     ADVANCED DIRECTIVES:    HEALTH MAINTENANCE: Social History  Substance Use Topics  . Smoking status: Never Smoker   . Smokeless tobacco: Never Used  . Alcohol Use: No     Colonoscopy:  PAP:  Bone density:  Lipid panel:  Allergies  Allergen Reactions  . Alendronate Sodium Other (See Comments)  . Calcitonin   . Cephalexin Other (See Comments)  . Raloxifene Other (See Comments)  . Tramadol Other (See Comments)    Current Outpatient Prescriptions  Medication Sig Dispense Refill  . acetaminophen (TYLENOL) 500 MG tablet Take 500 mg by mouth every 6 (six) hours as needed.    Marland Kitchen amLODipine (NORVASC) 5 MG tablet Take 5 mg by mouth daily.    Marland Kitchen aspirin 81 MG tablet Take 81 mg by mouth daily.    Marland Kitchen  atorvastatin (LIPITOR) 10 MG tablet Take 10 mg by mouth daily.    . Cyanocobalamin (B-12) 2500 MCG TABS Take 1 tablet by mouth every 30 (thirty) days.     . furosemide (LASIX) 20 MG tablet Take 20 mg by mouth every other day.    . hydroxyurea (HYDREA) 500 MG capsule Take 1 capsule by mouth daily.    . iron polysaccharides  (NIFEREX) 150 MG capsule Take 150 mg by mouth daily.    . isosorbide mononitrate (IMDUR) 60 MG 24 hr tablet Take 1 tablet (60 mg total) by mouth daily. 30 tablet 0  . metoprolol succinate (TOPROL-XL) 50 MG 24 hr tablet Take 1 tablet by mouth daily.    Marland Kitchen omeprazole (PRILOSEC) 20 MG capsule Take 1 capsule (20 mg total) by mouth 2 (two) times daily before a meal.    . potassium chloride SA (K-DUR,KLOR-CON) 20 MEQ tablet Take 20 mEq by mouth every other day.      No current facility-administered medications for this visit.    OBJECTIVE: BP 136/74 mmHg  Pulse 66  Temp(Src) 97.2 F (36.2 C) (Tympanic)  Wt 121 lb (54.885 kg)  SpO2 97%   Body mass index is 22.87 kg/(m^2).    ECOG FS:0 - Asymptomatic  General: Well-developed, well-nourished, no acute distress. Independently ambulatory. Eyes: Pink conjunctiva, anicteric sclera. HEENT: Normocephalic, moist mucous membranes, clear oropharnyx. Lungs: Clear to auscultation bilaterally. Heart: Regular rate and rhythm. No rubs, murmurs, or gallops. Abdomen: Soft, nontender, nondistended. No organomegaly noted, normoactive bowel sounds. Musculoskeletal: No edema, cyanosis, or clubbing. Neuro: Alert, answering all questions appropriately. Cranial nerves grossly intact. Skin: Scaly lesions over forehead and scalp. Psych: Normal affect.    LAB RESULTS:  Appointment on 05/16/2015  Component Date Value Ref Range Status  . WBC 05/16/2015 5.0  3.6 - 11.0 K/uL Final  . RBC 05/16/2015 2.65* 3.80 - 5.20 MIL/uL Final  . Hemoglobin 05/16/2015 10.3* 12.0 - 16.0 g/dL Final  . HCT 05/16/2015 29.7* 35.0 - 47.0 % Final  . MCV 05/16/2015 112.1* 80.0 - 100.0 fL Final  . MCH 05/16/2015 38.8* 26.0 - 34.0 pg Final  . MCHC 05/16/2015 34.6  32.0 - 36.0 g/dL Final  . RDW 05/16/2015 12.6  11.5 - 14.5 % Final  . Platelets 05/16/2015 450* 150 - 440 K/uL Final  . Neutrophils Relative % 05/16/2015 68   Final  . Neutro Abs 05/16/2015 3.5  1.4 - 6.5 K/uL Final  .  Lymphocytes Relative 05/16/2015 18   Final  . Lymphs Abs 05/16/2015 0.9* 1.0 - 3.6 K/uL Final  . Monocytes Relative 05/16/2015 10   Final  . Monocytes Absolute 05/16/2015 0.5  0.2 - 0.9 K/uL Final  . Eosinophils Relative 05/16/2015 3   Final  . Eosinophils Absolute 05/16/2015 0.2  0 - 0.7 K/uL Final  . Basophils Relative 05/16/2015 1   Final  . Basophils Absolute 05/16/2015 0.0  0 - 0.1 K/uL Final    STUDIES: No results found.  ASSESSMENT:  Essential thrombocytosis  PLAN:   1. Essential thrombocytosis. All previous workup has been negative, iron deficiency and polycythemia vera have been ruled out. Platelets are currently stable at 450 K. Hemoglobin remains stable. Patient has a known AV malformation incidentally found on GI workup likely the cause of anemia. We'll continue with hydroxyurea 500 mg daily.  Patient expressed understanding and was in agreement with this plan. She also understands that She can call clinic at any time with any questions, concerns, or complaints.   Dr.  Choksi was available for consultation and review of plan of care for this patient.   Evlyn Kanner, NP   05/16/2015 11:10 AM

## 2015-05-16 NOTE — Progress Notes (Signed)
Patient here for follow up no complaints today. 

## 2015-05-28 ENCOUNTER — Other Ambulatory Visit: Payer: Self-pay | Admitting: Family Medicine

## 2015-08-15 ENCOUNTER — Inpatient Hospital Stay (HOSPITAL_BASED_OUTPATIENT_CLINIC_OR_DEPARTMENT_OTHER): Payer: Medicare Other | Admitting: Internal Medicine

## 2015-08-15 ENCOUNTER — Inpatient Hospital Stay: Payer: Medicare Other | Attending: Internal Medicine

## 2015-08-15 VITALS — BP 139/68 | HR 65 | Temp 97.0°F | Resp 18 | Wt 123.5 lb

## 2015-08-15 DIAGNOSIS — D7589 Other specified diseases of blood and blood-forming organs: Secondary | ICD-10-CM

## 2015-08-15 DIAGNOSIS — I252 Old myocardial infarction: Secondary | ICD-10-CM | POA: Insufficient documentation

## 2015-08-15 DIAGNOSIS — I129 Hypertensive chronic kidney disease with stage 1 through stage 4 chronic kidney disease, or unspecified chronic kidney disease: Secondary | ICD-10-CM | POA: Diagnosis not present

## 2015-08-15 DIAGNOSIS — Z8673 Personal history of transient ischemic attack (TIA), and cerebral infarction without residual deficits: Secondary | ICD-10-CM | POA: Insufficient documentation

## 2015-08-15 DIAGNOSIS — Z79899 Other long term (current) drug therapy: Secondary | ICD-10-CM | POA: Diagnosis not present

## 2015-08-15 DIAGNOSIS — D473 Essential (hemorrhagic) thrombocythemia: Secondary | ICD-10-CM | POA: Insufficient documentation

## 2015-08-15 DIAGNOSIS — D649 Anemia, unspecified: Secondary | ICD-10-CM | POA: Diagnosis not present

## 2015-08-15 DIAGNOSIS — Z7982 Long term (current) use of aspirin: Secondary | ICD-10-CM | POA: Diagnosis not present

## 2015-08-15 DIAGNOSIS — I2510 Atherosclerotic heart disease of native coronary artery without angina pectoris: Secondary | ICD-10-CM | POA: Insufficient documentation

## 2015-08-15 DIAGNOSIS — K219 Gastro-esophageal reflux disease without esophagitis: Secondary | ICD-10-CM | POA: Diagnosis not present

## 2015-08-15 DIAGNOSIS — N189 Chronic kidney disease, unspecified: Secondary | ICD-10-CM | POA: Insufficient documentation

## 2015-08-15 LAB — CBC WITH DIFFERENTIAL/PLATELET
BASOS ABS: 0 10*3/uL (ref 0–0.1)
Basophils Relative: 1 %
EOS PCT: 1 %
Eosinophils Absolute: 0 10*3/uL (ref 0–0.7)
HEMATOCRIT: 31.5 % — AB (ref 35.0–47.0)
Hemoglobin: 10.6 g/dL — ABNORMAL LOW (ref 12.0–16.0)
LYMPHS PCT: 20 %
Lymphs Abs: 0.8 10*3/uL — ABNORMAL LOW (ref 1.0–3.6)
MCH: 37.4 pg — ABNORMAL HIGH (ref 26.0–34.0)
MCHC: 33.5 g/dL (ref 32.0–36.0)
MCV: 111.7 fL — ABNORMAL HIGH (ref 80.0–100.0)
MONO ABS: 0.3 10*3/uL (ref 0.2–0.9)
MONOS PCT: 8 %
NEUTROS ABS: 2.7 10*3/uL (ref 1.4–6.5)
Neutrophils Relative %: 70 %
Platelets: 391 10*3/uL (ref 150–440)
RBC: 2.82 MIL/uL — ABNORMAL LOW (ref 3.80–5.20)
RDW: 14.3 % (ref 11.5–14.5)
WBC: 3.8 10*3/uL (ref 3.6–11.0)

## 2015-08-15 NOTE — Progress Notes (Signed)
[place] OFFICE PROGRESS NOTE  Patient Care Team: Dion Body, MD as PCP - General (Family Medicine)   SUMMARY OF ONCOLOGIC HISTORY:  # ESSENTIAL THROMBOCYTOSIS [Jak-2-NEG; Bcr-abl-NEG] on Hydrea 500 day.   INTERVAL HISTORY:  This is my first interaction with the patient since I joined the practice September 2016. I reviewed the patient's prior charts  In detail/ Summarized above.   A very pleasant 81 year old female patient with above history of  Essential thrombocytosis currently on Hydrea 500 mg once a day is here for follow-up.  Patient denies any sores in the mouth denies any nausea vomiting;  Denies any skin rash or  Leg ulcers.  No recent blood clots- arterial or venous.   no night sweats no weight loss. No unusual fatigue or frequent infections.   REVIEW OF SYSTEMS:  A complete 10 point review of system is done which is negative except mentioned above/history of present illness.   PAST MEDICAL HISTORY :  Past Medical History  Diagnosis Date  . Anemia   . HTN (hypertension)   . TIA (transient ischemic attack)   . Diverticulosis   . GERD (gastroesophageal reflux disease)   . NSTEMI (non-ST elevated myocardial infarction)   . Osteoporosis   . CKD (chronic kidney disease)   . Thrombocytosis   . GI bleed   . CAD (coronary artery disease)   . Essential thrombocytosis 05/16/2015    PAST SURGICAL HISTORY :   Past Surgical History  Procedure Laterality Date  . Abdominal hysterectomy    . Colonoscopy      FAMILY HISTORY :   Family History  Problem Relation Age of Onset  . Stroke Father   . Cancer Mother     SOCIAL HISTORY:   Social History  Substance Use Topics  . Smoking status: Never Smoker   . Smokeless tobacco: Never Used  . Alcohol Use: No    ALLERGIES:  is allergic to alendronate sodium; calcitonin; cephalexin; raloxifene; and tramadol.  MEDICATIONS:  Current Outpatient Prescriptions  Medication Sig Dispense Refill  .  acetaminophen (TYLENOL) 500 MG tablet Take 500 mg by mouth every 6 (six) hours as needed.    Marland Kitchen amLODipine (NORVASC) 5 MG tablet Take 5 mg by mouth daily.    Marland Kitchen aspirin 81 MG tablet Take 81 mg by mouth daily.    Marland Kitchen atorvastatin (LIPITOR) 10 MG tablet Take 10 mg by mouth daily.    . Cyanocobalamin (B-12) 2500 MCG TABS Take 1 tablet by mouth every 30 (thirty) days.     . furosemide (LASIX) 20 MG tablet Take 20 mg by mouth every other day.    . hydroxyurea (HYDREA) 500 MG capsule TAKE 2 CAPSULES BY MOUTH ON FRIDAYS, AND 1 CAPSULE ALL OTHER DAYS OF THE WEEK 102 capsule 0  . iron polysaccharides (NIFEREX) 150 MG capsule Take 150 mg by mouth daily.    . isosorbide mononitrate (IMDUR) 60 MG 24 hr tablet Take 1 tablet (60 mg total) by mouth daily. 30 tablet 0  . metoprolol succinate (TOPROL-XL) 50 MG 24 hr tablet Take 1 tablet by mouth daily.    Marland Kitchen omeprazole (PRILOSEC) 20 MG capsule Take 1 capsule (20 mg total) by mouth 2 (two) times daily before a meal.    . potassium chloride SA (K-DUR,KLOR-CON) 20 MEQ tablet Take 20 mEq by mouth every other day.      No current facility-administered medications for this visit.    PHYSICAL EXAMINATION:  BP 139/68 mmHg  Pulse 65  Temp(Src) 97 F (36.1 C) (Tympanic)  Resp 18  Wt 123 lb 7.3 oz (56 kg)  Filed Weights   08/15/15 1020  Weight: 123 lb 7.3 oz (56 kg)    GENERAL: elderly Caucasian female patient. Well-nourished well-developed; Alert, no distress and comfortable.   Accompanied by her grandson. EYES: no pallor or icterus OROPHARYNX: no thrush or ulceration; poor dentition  NECK: supple, no masses felt LYMPH:  no palpable lymphadenopathy in the cervical, axillary or inguinal regions LUNGS: clear to auscultation and  No wheeze or crackles HEART/CVS: regular rate & rhythm and no murmurs; No lower extremity edema ABDOMEN:abdomen soft, non-tender and normal bowel sounds Musculoskeletal:no cyanosis of digits and no clubbing  PSYCH: alert & oriented x 3  with fluent speech NEURO: no focal motor/sensory deficits SKIN:  no rashes or significant lesions  LABORATORY DATA:  I have reviewed the data as listed    Component Value Date/Time   NA 141 04/22/2015 0644   NA 141 06/03/2014 2032   K 4.3 04/22/2015 0644   K 3.6 06/03/2014 2032   CL 109 04/22/2015 0644   CL 107 06/03/2014 2032   CO2 24 04/22/2015 0644   CO2 27 06/03/2014 2032   GLUCOSE 108* 04/22/2015 0644   GLUCOSE 120* 06/03/2014 2032   BUN 27* 04/22/2015 0644   BUN 23* 06/03/2014 2032   CREATININE 1.28* 04/22/2015 0644   CREATININE 1.32* 06/03/2014 2032   CALCIUM 8.8* 04/22/2015 0644   CALCIUM 9.2 06/03/2014 2032   PROT 6.9 03/27/2015 1012   PROT 7.2 04/13/2014 1050   ALBUMIN 4.1 03/27/2015 1012   ALBUMIN 3.7 04/13/2014 1050   AST 19 03/27/2015 1012   AST 26 04/13/2014 1050   ALT 11* 03/27/2015 1012   ALT 17 04/13/2014 1050   ALKPHOS 70 03/27/2015 1012   ALKPHOS 70 04/13/2014 1050   BILITOT 0.8 03/27/2015 1012   BILITOT 0.3 04/13/2014 1050   GFRNONAA 35* 04/22/2015 0644   GFRNONAA 40* 06/03/2014 2032   GFRNONAA 29* 04/13/2014 1050   GFRAA 40* 04/22/2015 0644   GFRAA 48* 06/03/2014 2032   GFRAA 33* 04/13/2014 1050    No results found for: SPEP, UPEP  Lab Results  Component Value Date   WBC 3.8 08/15/2015   NEUTROABS 2.7 08/15/2015   HGB 10.6* 08/15/2015   HCT 31.5* 08/15/2015   MCV 111.7* 08/15/2015   PLT 391 08/15/2015      Chemistry      Component Value Date/Time   NA 141 04/22/2015 0644   NA 141 06/03/2014 2032   K 4.3 04/22/2015 0644   K 3.6 06/03/2014 2032   CL 109 04/22/2015 0644   CL 107 06/03/2014 2032   CO2 24 04/22/2015 0644   CO2 27 06/03/2014 2032   BUN 27* 04/22/2015 0644   BUN 23* 06/03/2014 2032   CREATININE 1.28* 04/22/2015 0644   CREATININE 1.32* 06/03/2014 2032      Component Value Date/Time   CALCIUM 8.8* 04/22/2015 0644   CALCIUM 9.2 06/03/2014 2032   ALKPHOS 70 03/27/2015 1012   ALKPHOS 70 04/13/2014 1050   AST 19  03/27/2015 1012   AST 26 04/13/2014 1050   ALT 11* 03/27/2015 1012   ALT 17 04/13/2014 1050   BILITOT 0.8 03/27/2015 1012   BILITOT 0.3 04/13/2014 1050       RADIOGRAPHIC STUDIES: I have personally reviewed the radiological images as listed and agreed with the findings in the report. No results found.   ASSESSMENT & PLAN:   #  ESSENTIAL THROMBOCYTOSIS-  Barnabas Lister 2 negative;  On Hydrea 500 mg once a day.  Patient tolerating therapy very well.  Patient's platelets have been under good control today the platelet count is 390.  Recent LFTs from November 2016 with the normal limits.  #  Anemia with macrocytosis-  Hemoglobin 10.5-  Likely from  Hydrea.  #  Recommend follow-up in 6 months/ CBC CMP.  I reviewed the labs with the patient/ grandson in detail. A copy of the report was good. If patient feels any bad clinically;  Grandson agrees to call us for an earlier appointment.  # 15 minutes face-to-face with the patient discussing the above plan of care; more than 50% of time spent on prognosis/ natural history; counseling and coordination.   Orders Placed This Encounter  Procedures  . CBC with Differential/Platelet    Standing Status: Future     Number of Occurrences:      Standing Expiration Date: 08/14/2016  . Comprehensive metabolic panel    Standing Status: Future     Number of Occurrences:      Standing Expiration Date: 08/14/2016        Cammie Sickle, MD 08/15/2015 10:39 AM

## 2015-09-03 ENCOUNTER — Other Ambulatory Visit: Payer: Self-pay | Admitting: Family Medicine

## 2015-12-16 ENCOUNTER — Other Ambulatory Visit: Payer: Self-pay | Admitting: Family Medicine

## 2016-01-03 ENCOUNTER — Emergency Department
Admission: EM | Admit: 2016-01-03 | Discharge: 2016-01-03 | Disposition: A | Discharge status: Home / Self Care | Payer: Medicare Other | Attending: Emergency Medicine | Admitting: Emergency Medicine

## 2016-01-03 ENCOUNTER — Emergency Department: Payer: Medicare Other

## 2016-01-03 ENCOUNTER — Encounter: Payer: Self-pay | Admitting: Emergency Medicine

## 2016-01-03 DIAGNOSIS — I2510 Atherosclerotic heart disease of native coronary artery without angina pectoris: Secondary | ICD-10-CM | POA: Insufficient documentation

## 2016-01-03 DIAGNOSIS — R42 Dizziness and giddiness: Secondary | ICD-10-CM | POA: Diagnosis not present

## 2016-01-03 DIAGNOSIS — R0602 Shortness of breath: Secondary | ICD-10-CM | POA: Insufficient documentation

## 2016-01-03 DIAGNOSIS — N184 Chronic kidney disease, stage 4 (severe): Secondary | ICD-10-CM | POA: Insufficient documentation

## 2016-01-03 DIAGNOSIS — Z79899 Other long term (current) drug therapy: Secondary | ICD-10-CM | POA: Diagnosis not present

## 2016-01-03 DIAGNOSIS — M810 Age-related osteoporosis without current pathological fracture: Secondary | ICD-10-CM | POA: Insufficient documentation

## 2016-01-03 DIAGNOSIS — Z8673 Personal history of transient ischemic attack (TIA), and cerebral infarction without residual deficits: Secondary | ICD-10-CM | POA: Diagnosis not present

## 2016-01-03 DIAGNOSIS — Z7982 Long term (current) use of aspirin: Secondary | ICD-10-CM | POA: Diagnosis not present

## 2016-01-03 DIAGNOSIS — I129 Hypertensive chronic kidney disease with stage 1 through stage 4 chronic kidney disease, or unspecified chronic kidney disease: Secondary | ICD-10-CM | POA: Insufficient documentation

## 2016-01-03 DIAGNOSIS — R079 Chest pain, unspecified: Secondary | ICD-10-CM | POA: Diagnosis not present

## 2016-01-03 DIAGNOSIS — R531 Weakness: Secondary | ICD-10-CM | POA: Diagnosis present

## 2016-01-03 LAB — URINALYSIS COMPLETE WITH MICROSCOPIC (ARMC ONLY)
BILIRUBIN URINE: NEGATIVE
Glucose, UA: NEGATIVE mg/dL
KETONES UR: NEGATIVE mg/dL
Leukocytes, UA: NEGATIVE
Nitrite: NEGATIVE
PROTEIN: NEGATIVE mg/dL
Specific Gravity, Urine: 1.005 (ref 1.005–1.030)
pH: 6 (ref 5.0–8.0)

## 2016-01-03 LAB — BASIC METABOLIC PANEL
Anion gap: 9 (ref 5–15)
BUN: 24 mg/dL — AB (ref 6–20)
CHLORIDE: 105 mmol/L (ref 101–111)
CO2: 26 mmol/L (ref 22–32)
CREATININE: 1.32 mg/dL — AB (ref 0.44–1.00)
Calcium: 9.6 mg/dL (ref 8.9–10.3)
GFR calc Af Amer: 38 mL/min — ABNORMAL LOW (ref >60)
GFR calc non Af Amer: 33 mL/min — ABNORMAL LOW (ref >60)
Glucose, Bld: 127 mg/dL — ABNORMAL HIGH (ref 65–99)
Potassium: 4.2 mmol/L (ref 3.5–5.1)
Sodium: 140 mmol/L (ref 135–145)

## 2016-01-03 LAB — CBC
HCT: 33.3 % — ABNORMAL LOW (ref 35.0–47.0)
Hemoglobin: 11.3 g/dL — ABNORMAL LOW (ref 12.0–16.0)
MCH: 40.6 pg — AB (ref 26.0–34.0)
MCHC: 33.8 g/dL (ref 32.0–36.0)
MCV: 120.1 fL — AB (ref 80.0–100.0)
PLATELETS: 379 10*3/uL (ref 150–440)
RBC: 2.77 MIL/uL — ABNORMAL LOW (ref 3.80–5.20)
RDW: 13.7 % (ref 11.5–14.5)
WBC: 6 10*3/uL (ref 3.6–11.0)

## 2016-01-03 LAB — TROPONIN I: Troponin I: <0.03 ng/mL (ref <0.031)

## 2016-01-03 MED ORDER — ASPIRIN EC 325 MG PO TBEC
325.0000 mg | DELAYED_RELEASE_TABLET | Freq: Once | ORAL | Status: AC
  Administered 2016-01-03: 325 mg via ORAL
  Filled 2016-01-03: qty 1

## 2016-01-03 NOTE — ED Provider Notes (Signed)
[facility] Emergency Department Provider Note  ____________________________________________  Time seen: Approximately 1:28 PM  I have reviewed the triage vital signs and the nursing notes.   HISTORY  Chief Complaint Weakness    HPI [name] is a 82 y.o. female with a history of CAD status post N-STEMI, HTN, TIA presenting with chest pain, hypertension and generalized weakness. The patient reports that she had a brief episode of a coughing spasm last night "choking" bed." At 4 AM, she had some mild chest discomfort and drink some water to clear her throat. In the mid morning, she was standing in her kitchen when she began to have some generalized weakness associated with a hot feeling, and when she sat down she developed some substernal nonradiating chest pain that was not associated with nausea vomiting or palpitations. She did become short of breath with this episode. No recent leg swelling, cough or cold symptoms. She took her blood pressure and her systolic was [id number]. Her symptoms and blood pressure improved after a sublingual nitroglycerin. She does not regularly take nitroglycerin. Patient took 81 mg aspirin this morning.   Past Medical History  Diagnosis Date  . Anemia   . HTN (hypertension)   . TIA (transient ischemic attack)   . Diverticulosis   . GERD (gastroesophageal reflux disease)   . NSTEMI (non-ST elevated myocardial infarction) ([place])   . Osteoporosis   . CKD (chronic kidney disease)   . Thrombocytosis ([place])   . GI bleed   . CAD (coronary artery disease)   . Essential thrombocytosis ([place]) 05/16/2015    Patient Active Problem List   Diagnosis Date Noted  . Essential thrombocytosis ([place]) 05/16/2015  . Unstable angina ([place]) 04/22/2015  . HTN (hypertension) 04/22/2015  . GERD (gastroesophageal reflux disease) 04/22/2015  . CKD (chronic kidney disease), stage IV ([place]) 04/22/2015  . Thrombocytosis ([place]) 04/22/2015  . History of GI bleed  04/22/2015  . CAD (coronary artery disease) 04/22/2015  . Macrocytic anemia 03/27/2015    Past Surgical History  Procedure Laterality Date  . Abdominal hysterectomy    . Colonoscopy      Current Outpatient Rx  Name  Route  Sig  Dispense  Refill  . acetaminophen (TYLENOL) 500 MG tablet   Oral   Take 500 mg by mouth every 6 (six) hours as needed.         Marland Kitchen amLODipine (NORVASC) 5 MG tablet   Oral   Take 5 mg by mouth daily.         Marland Kitchen aspirin 81 MG tablet   Oral   Take 81 mg by mouth daily.         Marland Kitchen atorvastatin (LIPITOR) 10 MG tablet   Oral   Take 10 mg by mouth daily.         . Cyanocobalamin (B-12) 2500 MCG TABS   Oral   Take 1 tablet by mouth every 30 (thirty) days.          . furosemide (LASIX) 20 MG tablet   Oral   Take 20 mg by mouth every other day.         . hydroxyurea (HYDREA) 500 MG capsule      TAKE 2 CAPSULES BY MOUTH ON FRIDAYS, AND 1 CAPSULE ALL OTHER DAYS OF THE WEEK   102 capsule   0   . iron polysaccharides (NIFEREX) 150 MG capsule   Oral   Take 150 mg by mouth daily.         Marland Kitchen  isosorbide mononitrate (IMDUR) 60 MG 24 hr tablet   Oral   Take 1 tablet (60 mg total) by mouth daily.   30 tablet   0   . metoprolol succinate (TOPROL-XL) 50 MG 24 hr tablet   Oral   Take 1 tablet by mouth daily.         Marland Kitchen omeprazole (PRILOSEC) 20 MG capsule   Oral   Take 1 capsule (20 mg total) by mouth 2 (two) times daily before a meal.         . potassium chloride SA (K-DUR,KLOR-CON) 20 MEQ tablet   Oral   Take 20 mEq by mouth every other day.            Allergies Alendronate sodium; Calcitonin; Cephalexin; Raloxifene; and Tramadol  Family History  Problem Relation Age of Onset  . Stroke Father   . Cancer Mother     Social History Social History  Substance Use Topics  . Smoking status: Never Smoker   . Smokeless tobacco: Never Used  . Alcohol Use: No    Review of Systems Constitutional: No fever/chills.Positive  generalized weakness. No lightheadedness or syncope. Eyes: No visual changes. No blurred or double vision. ENT: No sore throat. No congestion or rhinorrhea. Cardiovascular: Positive chest pain. Denies palpitations. Respiratory: Positive shortness of breath.  No cough. Gastrointestinal: No abdominal pain.  No nausea, no vomiting.  No diarrhea.  No constipation. Genitourinary: Negative for dysuria. Musculoskeletal: Negative for back pain. No lower cavity swelling. No calf tenderness. Skin: Negative for rash. Neurological: Negative for headaches. No focal numbness, tingling or weakness.   10-point ROS otherwise negative.  ____________________________________________   PHYSICAL EXAM:  VITAL SIGNS: ED Triage Vitals  Enc Vitals Group     BP 01/03/16 1300 116/50 mmHg     Pulse Rate 01/03/16 1300 69     Resp 01/03/16 1300 20     Temp --      Temp src --      SpO2 01/03/16 1300 99 %     Weight --      Height --      Head Cir --      Peak Flow --      Pain Score --      Pain Loc --      Pain Edu? --      Excl. in [place]? --     Constitutional: Alert and oriented. Elderly and in no acute distress. Answers questions appropriately. Eyes: Conjunctivae are normal.  EOMI. No scleral icterus. Head: Atraumatic. Nose: No congestion/rhinnorhea. Mouth/Throat: Mucous membranes are moist.  Neck: No stridor.  Supple.  No JVD. No meningismus. Cardiovascular: Normal rate, regular rhythm. No murmurs, rubs or gallops.  Respiratory: Normal respiratory effort.  No accessory muscle use or retractions. Lungs CTAB.  No wheezes, rales or ronchi. Gastrointestinal: Soft, nontender and nondistended.  No guarding or rebound.  No peritoneal signs. Musculoskeletal: No LE edema. No ttp in the calves or palpable cords.  Negative Homan's sign. Neurologic:  A&Ox3.  Speech is clear.  Face and smile are symmetric.  EOMI.  Moves all extremities well. Skin:  Skin is warm, dry and intact. No rash noted. Psychiatric:  Mood and affect are normal. Speech and behavior are normal.  Normal judgement.  ____________________________________________   LABS (all labs ordered are listed, but only abnormal results are displayed)  Labs Reviewed  BASIC METABOLIC PANEL - Abnormal; Notable for the following:    Glucose, Bld 127 (*)    BUN  24 (*)    Creatinine, Ser 1.32 (*)    GFR calc non Af Amer 33 (*)    GFR calc Af Amer 38 (*)    All other components within normal limits  CBC - Abnormal; Notable for the following:    RBC 2.77 (*)    Hemoglobin 11.3 (*)    HCT 33.3 (*)    MCV 120.1 (*)    MCH 40.6 (*)    All other components within normal limits  URINALYSIS COMPLETEWITH MICROSCOPIC ([facility] ONLY) - Abnormal; Notable for the following:    Color, Urine COLORLESS (*)    APPearance CLEAR (*)    Hgb urine dipstick 1+ (*)    Bacteria, UA RARE (*)    Squamous Epithelial / LPF 0-5 (*)    All other components within normal limits  TROPONIN I  CBG MONITORING, ED   ____________________________________________  EKG  ED ECG REPORT I, Eula Listen, the attending physician, personally viewed and interpreted this ECG.   Date: 01/03/2016  EKG Time: 1229  Rate: 72  Rhythm: normal sinus rhythm  Axis: Normal  Intervals:first-degree A-V block   ST&T Change: Nonspecific T-wave inversion in V1.  ____________________________________________  RADIOLOGY  Dg Chest 2 View  01/03/2016  CLINICAL DATA:  Sudden onset of weakness.  Chest pain. EXAM: CHEST - 2 VIEW COMPARISON:  One-view chest x-ray 04/21/2015 FINDINGS: Mild cardiac enlargement is present. Atherosclerotic changes are again noted at the aortic arch. There is no edema or effusion to suggest failure. Emphysematous changes are stable. No focal airspace disease is present. Degenerative changes are present in the thoracic spine. Remote right-sided rib fractures are noted. IMPRESSION: 1. Borderline cardiomegaly without failure. 2. Atherosclerosis. 3. Emphysema.  Electronically Signed   By: San Morelle M.D.   On: 01/03/2016 14:17    ____________________________________________   PROCEDURES  Procedure(s) performed: None  Critical Care performed: No ____________________________________________   INITIAL IMPRESSION / ASSESSMENT AND PLAN / ED COURSE  Pertinent labs & imaging results that were available during my care of the patient were reviewed by me and considered in my medical decision making (see chart for details).  82 y.o. female with a history of CAD, HTN, presenting with chest pain and shortness of breath associated with generalized weakness. The patient does not have any ischemic changes on her EKG, however, given her history and advanced age I'm concerned about possible cardiac causes for her symptoms. It is also possible that her chest pain comes from the coughing spasm that she had with food last night. She does not have any symptoms that would be consistent with pneumonia although a possible aspiration event cannot be excluded.   ----------------------------------------- 2:45 PM on 01/03/2016 -----------------------------------------  The patient's initial evaluation is reassuring. Her EKG does not show any ischemic changes, her vital signs are stable, and her troponin is negative. Her chest x-ray does not show any acute cardiopulmonary process. I have had a long discussion with the patient and her granddaughter about admission versus discharge. Both the patient and her granddaughter state that even if any further testing, or procedures were offered, that the patient would turn it down. I do not think there is additional benefit to keeping her at the hospital, although they both know that if the episode this morning was due to a cardiac cause, that she could worsen or potentially die if she goes home. However, given her wishes and her age, and that she is feeling better, I think it is reasonable for her to  go home. I will touch base  with her cardiologist, and plan to discharge her home. ____________________________________________  FINAL CLINICAL IMPRESSION(S) / ED DIAGNOSES  Final diagnoses:  Chest pain, unspecified chest pain type  Lightheadedness  Shortness of breath      NEW MEDICATIONS STARTED DURING THIS VISIT:  Discharge Medication List as of 01/03/2016  2:56 PM       Eula Listen, MD 01/03/16 1532

## 2016-01-03 NOTE — ED Notes (Signed)
Patient arrives via [facility] from home with c/o weakness. Patient was at home in the kitchen without any previous strenuous activity when she had an acute onset of  Weakness and  'feeling hot'. Patient self administered 1 SL nitro. EMS reported stable VS : 167/72,99%,72hr on arrival without notable distress. Patient arrived to room 11 and ambulated with 1 assist to restroom without any distress.

## 2016-01-03 NOTE — Discharge Instructions (Signed)
Please return to the emergency department if you develop chest pain, lightheadedness or fainting, shortness of breath, or any other symptoms concerning to you.

## 2016-02-13 ENCOUNTER — Inpatient Hospital Stay (HOSPITAL_BASED_OUTPATIENT_CLINIC_OR_DEPARTMENT_OTHER): Payer: Medicare Other | Admitting: Oncology

## 2016-02-13 ENCOUNTER — Inpatient Hospital Stay: Payer: Medicare Other | Attending: Internal Medicine

## 2016-02-13 VITALS — BP 133/65 | HR 68 | Temp 96.0°F | Resp 18 | Wt 120.2 lb

## 2016-02-13 DIAGNOSIS — Z79899 Other long term (current) drug therapy: Secondary | ICD-10-CM | POA: Diagnosis not present

## 2016-02-13 DIAGNOSIS — D649 Anemia, unspecified: Secondary | ICD-10-CM

## 2016-02-13 DIAGNOSIS — N189 Chronic kidney disease, unspecified: Secondary | ICD-10-CM | POA: Insufficient documentation

## 2016-02-13 DIAGNOSIS — I252 Old myocardial infarction: Secondary | ICD-10-CM | POA: Insufficient documentation

## 2016-02-13 DIAGNOSIS — I2510 Atherosclerotic heart disease of native coronary artery without angina pectoris: Secondary | ICD-10-CM | POA: Diagnosis not present

## 2016-02-13 DIAGNOSIS — I129 Hypertensive chronic kidney disease with stage 1 through stage 4 chronic kidney disease, or unspecified chronic kidney disease: Secondary | ICD-10-CM | POA: Diagnosis not present

## 2016-02-13 DIAGNOSIS — D473 Essential (hemorrhagic) thrombocythemia: Secondary | ICD-10-CM | POA: Diagnosis present

## 2016-02-13 DIAGNOSIS — Z7982 Long term (current) use of aspirin: Secondary | ICD-10-CM | POA: Diagnosis not present

## 2016-02-13 DIAGNOSIS — K219 Gastro-esophageal reflux disease without esophagitis: Secondary | ICD-10-CM | POA: Diagnosis not present

## 2016-02-13 DIAGNOSIS — Z8673 Personal history of transient ischemic attack (TIA), and cerebral infarction without residual deficits: Secondary | ICD-10-CM | POA: Insufficient documentation

## 2016-02-13 LAB — CBC WITH DIFFERENTIAL/PLATELET
BASOS ABS: 0 10*3/uL (ref 0–0.1)
BASOS PCT: 1 %
EOS ABS: 0.1 10*3/uL (ref 0–0.7)
EOS PCT: 1 %
HCT: 31.2 % — ABNORMAL LOW (ref 35.0–47.0)
HEMOGLOBIN: 10.8 g/dL — AB (ref 12.0–16.0)
Lymphocytes Relative: 20 %
Lymphs Abs: 1 10*3/uL (ref 1.0–3.6)
MCH: 41.1 pg — ABNORMAL HIGH (ref 26.0–34.0)
MCHC: 34.7 g/dL (ref 32.0–36.0)
MCV: 118.3 fL — ABNORMAL HIGH (ref 80.0–100.0)
MONO ABS: 0.4 10*3/uL (ref 0.2–0.9)
Monocytes Relative: 7 %
Neutro Abs: 3.6 10*3/uL (ref 1.4–6.5)
Neutrophils Relative %: 71 %
PLATELETS: 446 10*3/uL — AB (ref 150–440)
RBC: 2.63 MIL/uL — AB (ref 3.80–5.20)
RDW: 13.3 % (ref 11.5–14.5)
WBC: 5.1 10*3/uL (ref 3.6–11.0)

## 2016-02-13 LAB — COMPREHENSIVE METABOLIC PANEL
ALBUMIN: 4.1 g/dL (ref 3.5–5.0)
ALK PHOS: 63 U/L (ref 38–126)
ALT: 10 U/L — AB (ref 14–54)
AST: 19 U/L (ref 15–41)
Anion gap: 8 (ref 5–15)
BUN: 25 mg/dL — ABNORMAL HIGH (ref 6–20)
CALCIUM: 9.2 mg/dL (ref 8.9–10.3)
CHLORIDE: 106 mmol/L (ref 101–111)
CO2: 26 mmol/L (ref 22–32)
CREATININE: 1.43 mg/dL — AB (ref 0.44–1.00)
GFR calc non Af Amer: 30 mL/min — ABNORMAL LOW (ref >60)
GFR, EST AFRICAN AMERICAN: 35 mL/min — AB (ref >60)
GLUCOSE: 126 mg/dL — AB (ref 65–99)
Potassium: 4.2 mmol/L (ref 3.5–5.1)
SODIUM: 140 mmol/L (ref 135–145)
Total Bilirubin: 0.6 mg/dL (ref 0.3–1.2)
Total Protein: 7.1 g/dL (ref 6.5–8.1)

## 2016-02-13 NOTE — Progress Notes (Signed)
[place]  [phone]  [phone]     [name] DOB: 01-11-1919  MR#: [id number]  GSU#:[id number]  Patient Care Team: Dion Body, MD as PCP - General (Family Medicine)  CHIEF COMPLAINT:  Chief Complaint  Patient presents with  . Essential thromboyctosis    INTERVAL HISTORY:  Patient is here for continued follow-up and treatment consideration regarding essential thrombocytosis. She is currently taking Hydrea 500 mg once daily every day. She reports overall feeling very well. She has no complaints or concerns today. She denies any shortness of breath, chest pain, cough, lower extremity edema, diarrhea or constipation, no dizziness or change in mental status.  REVIEW OF SYSTEMS:   Review of Systems  Constitutional: Negative for fever, chills, weight loss, malaise/fatigue and diaphoresis.  HENT: Negative for congestion, ear discharge, ear pain, hearing loss, nosebleeds, sore throat and tinnitus.   Eyes: Negative for blurred vision, double vision, photophobia, pain, discharge and redness.  Respiratory: Negative for cough, hemoptysis, sputum production, shortness of breath, wheezing and stridor.   Cardiovascular: Negative for chest pain, palpitations, orthopnea, claudication, leg swelling and PND.  Gastrointestinal: Negative for heartburn, nausea, vomiting, abdominal pain, diarrhea, constipation, blood in stool and melena.  Genitourinary: Negative.   Musculoskeletal: Negative.   Skin: Negative.   Neurological: Negative for dizziness, tingling, focal weakness, seizures, weakness and headaches.  Endo/Heme/Allergies: Does not bruise/bleed easily.  Psychiatric/Behavioral: Negative for depression. The patient is not nervous/anxious and does not have insomnia.     As per HPI. Otherwise, a complete review of systems is negatve.  ONCOLOGY HISTORY:  No history exists.    PAST MEDICAL HISTORY: Past Medical History  Diagnosis Date  . Anemia   .  HTN (hypertension)   . TIA (transient ischemic attack)   . Diverticulosis   . GERD (gastroesophageal reflux disease)   . NSTEMI (non-ST elevated myocardial infarction) ([place])   . Osteoporosis   . CKD (chronic kidney disease)   . Thrombocytosis ([place])   . GI bleed   . CAD (coronary artery disease)   . Essential thrombocytosis ([place]) 05/16/2015    PAST SURGICAL HISTORY: Past Surgical History  Procedure Laterality Date  . Abdominal hysterectomy    . Colonoscopy      FAMILY HISTORY Family History  Problem Relation Age of Onset  . Stroke Father   . Cancer Mother     GYNECOLOGIC HISTORY:  No LMP recorded. Patient has had a hysterectomy.     ADVANCED DIRECTIVES:    HEALTH MAINTENANCE: Social History  Substance Use Topics  . Smoking status: Never Smoker   . Smokeless tobacco: Never Used  . Alcohol Use: No     Allergies  Allergen Reactions  . Alendronate Sodium Other (See Comments)  . Calcitonin   . Cephalexin Other (See Comments)  . Raloxifene Other (See Comments)  . Tramadol Other (See Comments)    Current Outpatient Prescriptions  Medication Sig Dispense Refill  . acetaminophen (TYLENOL) 500 MG tablet Take 500 mg by mouth every 6 (six) hours as needed.    Marland Kitchen amLODipine (NORVASC) 5 MG tablet Take 5 mg by mouth daily.    Marland Kitchen aspirin 81 MG tablet Take 81 mg by mouth daily.    Marland Kitchen atorvastatin (LIPITOR) 10 MG tablet Take 10 mg by mouth daily.    . Cyanocobalamin (B-12) 2500 MCG TABS Take 1 tablet by mouth every 30 (thirty) days.     . furosemide (LASIX) 20 MG tablet Take 20 mg by mouth every other  day.    . hydroxyurea (HYDREA) 500 MG capsule TAKE 2 CAPSULES BY MOUTH ON FRIDAYS, AND 1 CAPSULE ALL OTHER DAYS OF THE WEEK 102 capsule 0  . iron polysaccharides (NIFEREX) 150 MG capsule Take 150 mg by mouth daily.    . isosorbide mononitrate (IMDUR) 60 MG 24 hr tablet Take 1 tablet (60 mg total) by mouth daily. 30 tablet 0  . metoprolol succinate (TOPROL-XL) 50 MG 24 hr tablet  Take 1 tablet by mouth daily.    Marland Kitchen omeprazole (PRILOSEC) 20 MG capsule Take 1 capsule (20 mg total) by mouth 2 (two) times daily before a meal.    . potassium chloride SA (K-DUR,KLOR-CON) 20 MEQ tablet Take 20 mEq by mouth every other day.      No current facility-administered medications for this visit.    OBJECTIVE: BP 133/65 mmHg  Pulse 68  Temp(Src) 96 F (35.6 C) (Tympanic)  Resp 18  Wt 120 lb 2.4 oz (54.5 kg)   Body mass index is 22.71 kg/(m^2).    ECOG FS:0 - Asymptomatic  General: Well-developed, well-nourished, no acute distress. Independently ambulatory. Accompanied by her grandson. Eyes: No pallor or ecterus. HEENT: Normocephalic, moist mucous membranes, clear oropharnyx. Lungs: Clear to auscultation bilaterally. Heart: Regular rate and rhythm. No rubs, murmurs, or gallops. Abdomen: Soft, nontender, nondistended.  Musculoskeletal: No edema, cyanosis, or clubbing. Neuro: Alert, answering all questions appropriately.  Skin: No rashes or lesions Psych: Normal affect.   LAB RESULTS:  Appointment on 02/13/2016  Component Date Value Ref Range Status  . WBC 02/13/2016 5.1  3.6 - 11.0 K/uL Final  . RBC 02/13/2016 2.63* 3.80 - 5.20 MIL/uL Final  . Hemoglobin 02/13/2016 10.8* 12.0 - 16.0 g/dL Final  . HCT 02/13/2016 31.2* 35.0 - 47.0 % Final  . MCV 02/13/2016 118.3* 80.0 - 100.0 fL Final  . MCH 02/13/2016 41.1* 26.0 - 34.0 pg Final  . MCHC 02/13/2016 34.7  32.0 - 36.0 g/dL Final  . RDW 02/13/2016 13.3  11.5 - 14.5 % Final  . Platelets 02/13/2016 446* 150 - 440 K/uL Final  . Neutrophils Relative % 02/13/2016 71   Final  . Neutro Abs 02/13/2016 3.6  1.4 - 6.5 K/uL Final  . Lymphocytes Relative 02/13/2016 20   Final  . Lymphs Abs 02/13/2016 1.0  1.0 - 3.6 K/uL Final  . Monocytes Relative 02/13/2016 7   Final  . Monocytes Absolute 02/13/2016 0.4  0.2 - 0.9 K/uL Final  . Eosinophils Relative 02/13/2016 1   Final  . Eosinophils Absolute 02/13/2016 0.1  0 - 0.7 K/uL Final  .  Basophils Relative 02/13/2016 1   Final  . Basophils Absolute 02/13/2016 0.0  0 - 0.1 K/uL Final  . Sodium 02/13/2016 140  135 - 145 mmol/L Final  . Potassium 02/13/2016 4.2  3.5 - 5.1 mmol/L Final  . Chloride 02/13/2016 106  101 - 111 mmol/L Final  . CO2 02/13/2016 26  22 - 32 mmol/L Final  . Glucose, Bld 02/13/2016 126* 65 - 99 mg/dL Final  . BUN 02/13/2016 25* 6 - 20 mg/dL Final  . Creatinine, Ser 02/13/2016 1.43* 0.44 - 1.00 mg/dL Final  . Calcium 02/13/2016 9.2  8.9 - 10.3 mg/dL Final  . Total Protein 02/13/2016 7.1  6.5 - 8.1 g/dL Final  . Albumin 02/13/2016 4.1  3.5 - 5.0 g/dL Final  . AST 02/13/2016 19  15 - 41 U/L Final  . ALT 02/13/2016 10* 14 - 54 U/L Final  . Alkaline Phosphatase 02/13/2016 63  38 - 126 U/L Final  . Total Bilirubin 02/13/2016 0.6  0.3 - 1.2 mg/dL Final  . GFR calc non Af Amer 02/13/2016 30* >60 mL/min Final  . GFR calc Af Amer 02/13/2016 35* >60 mL/min Final   Comment: (NOTE) The eGFR has been calculated using the CKD EPI equation. This calculation has not been validated in all clinical situations. eGFR's persistently <60 mL/min signify possible Chronic Kidney Disease.   . Anion gap 02/13/2016 8  5 - 15 Final    STUDIES: No results found.  ASSESSMENT:  Essential thrombocytosis  PLAN:   1. Essential thrombocytosis. All previous workup has been negative, iron deficiency and polycythemia vera have been ruled out. Platelets stable at 446 today. Tolerating Hydrea well. Continue with 500 mg daily. Return to clinic in 6 months with repeat labs and evaluation.  2. Anemia: HGB stable at 10.8 today. Likely from Hydrea. Monitor.   Patient expressed understanding and was in agreement with this plan. She also understands that She can call clinic at any time with any questions, concerns, or complaints.   Dr. Rogue Bussing was available for consultation and review of plan of care for this patient.   Mayra Reel, NP   02/13/2016 10:54 AM

## 2016-03-23 ENCOUNTER — Other Ambulatory Visit: Payer: Self-pay | Admitting: Family Medicine

## 2016-03-27 ENCOUNTER — Telehealth: Payer: Self-pay

## 2016-03-27 DIAGNOSIS — D649 Anemia, unspecified: Secondary | ICD-10-CM

## 2016-03-27 MED ORDER — HYDROXYUREA 500 MG PO CAPS: ORAL_CAPSULE | ORAL | 5 refills | Status: DC

## 2016-03-27 NOTE — Telephone Encounter (Signed)
Patient called to request refill for Hydroxyurea.  Prescription send to Dr. Rogue Bussing for approval.

## 2016-08-13 ENCOUNTER — Inpatient Hospital Stay (HOSPITAL_BASED_OUTPATIENT_CLINIC_OR_DEPARTMENT_OTHER): Payer: Medicare Other | Admitting: Internal Medicine

## 2016-08-13 ENCOUNTER — Inpatient Hospital Stay: Payer: Medicare Other | Attending: Internal Medicine

## 2016-08-13 DIAGNOSIS — Z90710 Acquired absence of both cervix and uterus: Secondary | ICD-10-CM | POA: Insufficient documentation

## 2016-08-13 DIAGNOSIS — Z8673 Personal history of transient ischemic attack (TIA), and cerebral infarction without residual deficits: Secondary | ICD-10-CM | POA: Insufficient documentation

## 2016-08-13 DIAGNOSIS — D539 Nutritional anemia, unspecified: Secondary | ICD-10-CM | POA: Insufficient documentation

## 2016-08-13 DIAGNOSIS — I2510 Atherosclerotic heart disease of native coronary artery without angina pectoris: Secondary | ICD-10-CM | POA: Insufficient documentation

## 2016-08-13 DIAGNOSIS — D473 Essential (hemorrhagic) thrombocythemia: Secondary | ICD-10-CM

## 2016-08-13 DIAGNOSIS — I252 Old myocardial infarction: Secondary | ICD-10-CM | POA: Insufficient documentation

## 2016-08-13 DIAGNOSIS — Z79899 Other long term (current) drug therapy: Secondary | ICD-10-CM

## 2016-08-13 DIAGNOSIS — I129 Hypertensive chronic kidney disease with stage 1 through stage 4 chronic kidney disease, or unspecified chronic kidney disease: Secondary | ICD-10-CM | POA: Diagnosis not present

## 2016-08-13 DIAGNOSIS — K219 Gastro-esophageal reflux disease without esophagitis: Secondary | ICD-10-CM | POA: Diagnosis not present

## 2016-08-13 DIAGNOSIS — Z7982 Long term (current) use of aspirin: Secondary | ICD-10-CM | POA: Insufficient documentation

## 2016-08-13 DIAGNOSIS — N189 Chronic kidney disease, unspecified: Secondary | ICD-10-CM

## 2016-08-13 LAB — COMPREHENSIVE METABOLIC PANEL
ALBUMIN: 4.4 g/dL (ref 3.5–5.0)
ALK PHOS: 66 U/L (ref 38–126)
ALT: 10 U/L — AB (ref 14–54)
AST: 20 U/L (ref 15–41)
Anion gap: 7 (ref 5–15)
BUN: 32 mg/dL — AB (ref 6–20)
CALCIUM: 9.3 mg/dL (ref 8.9–10.3)
CO2: 26 mmol/L (ref 22–32)
CREATININE: 1.29 mg/dL — AB (ref 0.44–1.00)
Chloride: 106 mmol/L (ref 101–111)
GFR calc Af Amer: 39 mL/min — ABNORMAL LOW (ref >60)
GFR calc non Af Amer: 34 mL/min — ABNORMAL LOW (ref >60)
GLUCOSE: 132 mg/dL — AB (ref 65–99)
Potassium: 4 mmol/L (ref 3.5–5.1)
SODIUM: 139 mmol/L (ref 135–145)
Total Bilirubin: 0.6 mg/dL (ref 0.3–1.2)
Total Protein: 7.3 g/dL (ref 6.5–8.1)

## 2016-08-13 LAB — CBC WITH DIFFERENTIAL/PLATELET
BASOS PCT: 1 %
Basophils Absolute: 0 10*3/uL (ref 0–0.1)
EOS ABS: 0 10*3/uL (ref 0–0.7)
Eosinophils Relative: 1 %
HCT: 31.5 % — ABNORMAL LOW (ref 35.0–47.0)
HEMOGLOBIN: 10.6 g/dL — AB (ref 12.0–16.0)
LYMPHS ABS: 0.9 10*3/uL — AB (ref 1.0–3.6)
Lymphocytes Relative: 18 %
MCH: 38.5 pg — ABNORMAL HIGH (ref 26.0–34.0)
MCHC: 33.9 g/dL (ref 32.0–36.0)
MCV: 113.7 fL — ABNORMAL HIGH (ref 80.0–100.0)
Monocytes Absolute: 0.3 10*3/uL (ref 0.2–0.9)
Monocytes Relative: 7 %
NEUTROS ABS: 3.8 10*3/uL (ref 1.4–6.5)
NEUTROS PCT: 73 %
Platelets: 502 10*3/uL — ABNORMAL HIGH (ref 150–440)
RBC: 2.77 MIL/uL — AB (ref 3.80–5.20)
RDW: 14.2 % (ref 11.5–14.5)
WBC: 5.1 10*3/uL (ref 3.6–11.0)

## 2016-08-13 NOTE — Progress Notes (Signed)
[place]  [phone]  [phone]     TIPPI MCCRAE DOB: 1919-05-19  MR#: [id number]  QIO#:[id number]  Patient Care Team: Dion Body, MD as PCP - General (Family Medicine)  CHIEF COMPLAINT:  Chief Complaint  Patient presents with  . Essential thrombocytosis    # ESSENTIAL THROMBOCYTOSIS [Jak-2-NEG; Bcr-abl-NEG] on Hydrea 500 day.   INTERVAL HISTORY:   82 year old female patient with above history of essential thrombocytosis is here for follow-up. Patient is on Hydrea 500 mg a day. She is also on aspirin. Denies any blood clots. Denies any burning pain feet and hands. No weight loss or night sweats.  ROS: A complete 10 point review of system is done which is negative for mentioned above in history of present illness   REVIEW OF SYSTEMS:   Review of Systems  Constitutional: Negative for chills, diaphoresis, fever, malaise/fatigue and weight loss.  HENT: Negative for congestion, ear discharge, ear pain, hearing loss, nosebleeds, sore throat and tinnitus.   Eyes: Negative for blurred vision, double vision, photophobia, pain, discharge and redness.  Respiratory: Negative for cough, hemoptysis, sputum production, shortness of breath, wheezing and stridor.   Cardiovascular: Negative for chest pain, palpitations, orthopnea, claudication, leg swelling and PND.  Gastrointestinal: Negative for abdominal pain, blood in stool, constipation, diarrhea, heartburn, melena, nausea and vomiting.  Genitourinary: Negative.   Musculoskeletal: Negative.   Skin: Negative.   Neurological: Negative for dizziness, tingling, focal weakness, seizures, weakness and headaches.  Endo/Heme/Allergies: Does not bruise/bleed easily.  Psychiatric/Behavioral: Negative for depression. The patient is not nervous/anxious and does not have insomnia.     As per HPI. Otherwise, a complete review of systems is negatve.  ONCOLOGY HISTORY:  No history exists.    PAST MEDICAL  HISTORY: Past Medical History:  Diagnosis Date  . Anemia   . CAD (coronary artery disease)   . CKD (chronic kidney disease)   . Diverticulosis   . Essential thrombocytosis ([place]) 05/16/2015  . GERD (gastroesophageal reflux disease)   . GI bleed   . HTN (hypertension)   . NSTEMI (non-ST elevated myocardial infarction) ([place])   . Osteoporosis   . Thrombocytosis ([place])   . TIA (transient ischemic attack)     PAST SURGICAL HISTORY: Past Surgical History:  Procedure Laterality Date  . ABDOMINAL HYSTERECTOMY    . COLONOSCOPY      FAMILY HISTORY Family History  Problem Relation Age of Onset  . Stroke Father   . Cancer Mother     GYNECOLOGIC HISTORY:  No LMP recorded. Patient has had a hysterectomy.     ADVANCED DIRECTIVES:    HEALTH MAINTENANCE: Social History  Substance Use Topics  . Smoking status: Never Smoker  . Smokeless tobacco: Never Used  . Alcohol use No     Allergies  Allergen Reactions  . Alendronate Sodium Other (See Comments)  . Calcitonin   . Cephalexin Other (See Comments)  . Raloxifene Other (See Comments)  . Tramadol Other (See Comments)    Current Outpatient Prescriptions  Medication Sig Dispense Refill  . acetaminophen (TYLENOL) 500 MG tablet Take 500 mg by mouth every 6 (six) hours as needed.    Marland Kitchen amLODipine (NORVASC) 5 MG tablet Take 5 mg by mouth daily.    Marland Kitchen aspirin 81 MG tablet Take 81 mg by mouth daily.    Marland Kitchen atorvastatin (LIPITOR) 10 MG tablet Take 10 mg by mouth daily.    . Cyanocobalamin (B-12) 2500 MCG TABS Take 1 tablet by mouth every 30 (  thirty) days.     . furosemide (LASIX) 20 MG tablet Take 20 mg by mouth every other day.    . hydroxyurea (HYDREA) 500 MG capsule TAKE 2 CAPSULES BY MOUTH ON FRIDAYS, AND 1 CAPSULE ALL OTHER DAYS OF THE WEEK 102 capsule 5  . iron polysaccharides (NIFEREX) 150 MG capsule Take 150 mg by mouth daily.    . isosorbide mononitrate (IMDUR) 60 MG 24 hr tablet Take 1 tablet (60 mg total) by mouth daily. 30  tablet 0  . metoprolol succinate (TOPROL-XL) 50 MG 24 hr tablet Take 1 tablet by mouth daily.    Marland Kitchen omeprazole (PRILOSEC) 20 MG capsule Take 1 capsule (20 mg total) by mouth 2 (two) times daily before a meal.    . potassium chloride SA (K-DUR,KLOR-CON) 20 MEQ tablet Take 20 mEq by mouth every other day.      No current facility-administered medications for this visit.     OBJECTIVE: BP (!) 139/55 (BP Location: Right Arm, Patient Position: Sitting)   Pulse 61   Temp (!) 96.2 F (35.7 C) (Tympanic)   Wt 118 lb 8 oz (53.8 kg)   BMI 22.40 kg/m    Body mass index is 22.4 kg/m.    ECOG FS:0 - Asymptomatic  General: Well-developed, well-nourished, no acute distress. Independently ambulatory. Accompanied by her son. Eyes: No pallor or ecterus. HEENT: Normocephalic, moist mucous membranes, clear oropharnyx. Lungs: Clear to auscultation bilaterally. Heart: Regular rate and rhythm. No rubs, murmurs, or gallops. Abdomen: Soft, nontender, nondistended.  Musculoskeletal: No edema, cyanosis, or clubbing. Neuro: Alert, answering all questions appropriately.  Skin: No rashes or lesions Psych: Normal affect.   LAB RESULTS:  Appointment on 08/13/2016  Component Date Value Ref Range Status  . WBC 08/13/2016 5.1  3.6 - 11.0 K/uL Final  . RBC 08/13/2016 2.77* 3.80 - 5.20 MIL/uL Final  . Hemoglobin 08/13/2016 10.6* 12.0 - 16.0 g/dL Final  . HCT 08/13/2016 31.5* 35.0 - 47.0 % Final  . MCV 08/13/2016 113.7* 80.0 - 100.0 fL Final  . MCH 08/13/2016 38.5* 26.0 - 34.0 pg Final  . MCHC 08/13/2016 33.9  32.0 - 36.0 g/dL Final  . RDW 08/13/2016 14.2  11.5 - 14.5 % Final  . Platelets 08/13/2016 502* 150 - 440 K/uL Final  . Neutrophils Relative % 08/13/2016 73  % Final  . Neutro Abs 08/13/2016 3.8  1.4 - 6.5 K/uL Final  . Lymphocytes Relative 08/13/2016 18  % Final  . Lymphs Abs 08/13/2016 0.9* 1.0 - 3.6 K/uL Final  . Monocytes Relative 08/13/2016 7  % Final  . Monocytes Absolute 08/13/2016 0.3  0.2 -  0.9 K/uL Final  . Eosinophils Relative 08/13/2016 1  % Final  . Eosinophils Absolute 08/13/2016 0.0  0 - 0.7 K/uL Final  . Basophils Relative 08/13/2016 1  % Final  . Basophils Absolute 08/13/2016 0.0  0 - 0.1 K/uL Final  . Sodium 08/13/2016 139  135 - 145 mmol/L Final  . Potassium 08/13/2016 4.0  3.5 - 5.1 mmol/L Final  . Chloride 08/13/2016 106  101 - 111 mmol/L Final  . CO2 08/13/2016 26  22 - 32 mmol/L Final  . Glucose, Bld 08/13/2016 132* 65 - 99 mg/dL Final  . BUN 08/13/2016 32* 6 - 20 mg/dL Final  . Creatinine, Ser 08/13/2016 1.29* 0.44 - 1.00 mg/dL Final  . Calcium 08/13/2016 9.3  8.9 - 10.3 mg/dL Final  . Total Protein 08/13/2016 7.3  6.5 - 8.1 g/dL Final  . Albumin 08/13/2016 4.4  3.5 - 5.0 g/dL Final  . AST 08/13/2016 20  15 - 41 U/L Final  . ALT 08/13/2016 10* 14 - 54 U/L Final  . Alkaline Phosphatase 08/13/2016 66  38 - 126 U/L Final  . Total Bilirubin 08/13/2016 0.6  0.3 - 1.2 mg/dL Final  . GFR calc non Af Amer 08/13/2016 34* >60 mL/min Final  . GFR calc Af Amer 08/13/2016 39* >60 mL/min Final   Comment: (NOTE) The eGFR has been calculated using the CKD EPI equation. This calculation has not been validated in all clinical situations. eGFR's persistently <60 mL/min signify possible Chronic Kidney Disease.   . Anion gap 08/13/2016 7  5 - 15 Final    STUDIES: No results found.  ASSESSMENT:  Essential thrombocytosis  Essential thrombocytosis ([place]) # ESSENTIAL THROMBOCYTOSIS-  Jack 2 negative;  On Hydrea 500 mg once a day.  Patient tolerating therapy very well.  Patient's platelets have been under good control today the platelet count is 390.  Recent LFTs from November 2016 with the normal limits.  #  Anemia with macrocytosis-  Hemoglobin 10.5-  Likely from  Hydrea.  # CKD-   #  Recommend follow-up in 6 months/ CBC CMP.     Cammie Sickle, MD   08/14/2016 8:07 AM

## 2016-08-13 NOTE — Progress Notes (Signed)
Patient here today for follow up for Essential thrombocytosis.  No new concerns today

## 2016-08-13 NOTE — Assessment & Plan Note (Addendum)
#   ESSENTIAL THROMBOCYTOSIS-  [name] 2 negative;  On Hydrea 500 mg once a day/aspirin.  Patient tolerating therapy very well. Patient's platelets are slightly elevated around 500. Continue current regimen.   #  Anemia with macrocytosis-  Hemoglobin 10.5-  Likely from  Hydrea. Stable.  # CKD- creatinine 1.3/ continue follow up with PCP.   #  Recommend follow-up in 6 months/ CBC CMP.

## 2016-09-09 ENCOUNTER — Emergency Department: Payer: Medicare Other

## 2016-09-09 ENCOUNTER — Encounter: Payer: Self-pay | Admitting: Emergency Medicine

## 2016-09-09 ENCOUNTER — Emergency Department
Admission: EM | Admit: 2016-09-09 | Discharge: 2016-09-09 | Disposition: A | Discharge status: Home / Self Care | Payer: Medicare Other | Attending: Emergency Medicine | Admitting: Emergency Medicine

## 2016-09-09 DIAGNOSIS — Z7982 Long term (current) use of aspirin: Secondary | ICD-10-CM | POA: Diagnosis not present

## 2016-09-09 DIAGNOSIS — I252 Old myocardial infarction: Secondary | ICD-10-CM | POA: Insufficient documentation

## 2016-09-09 DIAGNOSIS — R079 Chest pain, unspecified: Secondary | ICD-10-CM | POA: Diagnosis not present

## 2016-09-09 DIAGNOSIS — R0789 Other chest pain: Secondary | ICD-10-CM | POA: Diagnosis present

## 2016-09-09 DIAGNOSIS — N189 Chronic kidney disease, unspecified: Secondary | ICD-10-CM | POA: Diagnosis not present

## 2016-09-09 DIAGNOSIS — Z79899 Other long term (current) drug therapy: Secondary | ICD-10-CM | POA: Insufficient documentation

## 2016-09-09 DIAGNOSIS — I25110 Atherosclerotic heart disease of native coronary artery with unstable angina pectoris: Secondary | ICD-10-CM | POA: Diagnosis not present

## 2016-09-09 DIAGNOSIS — I129 Hypertensive chronic kidney disease with stage 1 through stage 4 chronic kidney disease, or unspecified chronic kidney disease: Secondary | ICD-10-CM | POA: Insufficient documentation

## 2016-09-09 LAB — URINALYSIS, COMPLETE (UACMP) WITH MICROSCOPIC
Bilirubin Urine: NEGATIVE
GLUCOSE, UA: NEGATIVE mg/dL
Ketones, ur: NEGATIVE mg/dL
Leukocytes, UA: NEGATIVE
NITRITE: NEGATIVE
Protein, ur: NEGATIVE mg/dL
SPECIFIC GRAVITY, URINE: 1.008 (ref 1.005–1.030)
pH: 7 (ref 5.0–8.0)

## 2016-09-09 LAB — BASIC METABOLIC PANEL
ANION GAP: 5 (ref 5–15)
BUN: 27 mg/dL — ABNORMAL HIGH (ref 6–20)
CHLORIDE: 108 mmol/L (ref 101–111)
CO2: 26 mmol/L (ref 22–32)
CREATININE: 1.24 mg/dL — AB (ref 0.44–1.00)
Calcium: 9.1 mg/dL (ref 8.9–10.3)
GFR calc non Af Amer: 36 mL/min — ABNORMAL LOW (ref >60)
GFR, EST AFRICAN AMERICAN: 41 mL/min — AB (ref >60)
Glucose, Bld: 118 mg/dL — ABNORMAL HIGH (ref 65–99)
Potassium: 4.3 mmol/L (ref 3.5–5.1)
SODIUM: 139 mmol/L (ref 135–145)

## 2016-09-09 LAB — CBC
HCT: 29.8 % — ABNORMAL LOW (ref 35.0–47.0)
HEMOGLOBIN: 10.3 g/dL — AB (ref 12.0–16.0)
MCH: 39.3 pg — ABNORMAL HIGH (ref 26.0–34.0)
MCHC: 34.4 g/dL (ref 32.0–36.0)
MCV: 114.2 fL — AB (ref 80.0–100.0)
PLATELETS: 428 10*3/uL (ref 150–440)
RBC: 2.61 MIL/uL — AB (ref 3.80–5.20)
RDW: 14.7 % — ABNORMAL HIGH (ref 11.5–14.5)
WBC: 4.6 10*3/uL (ref 3.6–11.0)

## 2016-09-09 LAB — TROPONIN I: Troponin I: <0.03 ng/mL (ref <0.03)

## 2016-09-09 MED ORDER — ASPIRIN 81 MG PO CHEW
243.0000 mg | CHEWABLE_TABLET | Freq: Once | ORAL | Status: AC
  Administered 2016-09-09: 243 mg via ORAL
  Filled 2016-09-09: qty 3

## 2016-09-09 NOTE — ED Notes (Signed)
No chest pain or sob.  family with pt.  nsr on monitor.

## 2016-09-09 NOTE — ED Notes (Signed)
Pt up to bathroom with assistance.  Pt ate dinner meal.

## 2016-09-09 NOTE — ED Triage Notes (Signed)
Patient had CP central starting at noon today.  Denies radiation.  Relieved with Nitro.

## 2016-09-09 NOTE — ED Notes (Signed)
resumed care from [place] rn.  Pt alert.  Sinus on monitor.  Skin warm and dry.  Iv in place.  Family with pt.

## 2016-09-09 NOTE — ED Notes (Signed)
Patient is hard of hearing.  Continues to deny chest pain.  Cardiac monitor shows SR with occasional PVC.  Dr. Reita Cliche has been in to see patient.  Report given to [employer].

## 2016-09-09 NOTE — ED Provider Notes (Signed)
[facility] Emergency Department Provider Note ____________________________________________   I have reviewed the triage vital signs and the triage nursing note.  HISTORY  Chief Complaint Chest Pain   Historian Patient, son and grandson  HPI [name] is a 83 y.o. female with history of coronary artery disease, chronic kidney disease, and history of TIA and an STEMI, takes 81 mg aspirin daily and did take it this morning, presents for an episode of chest pain and pressure. States that she had been fine this morning when she woke up. At some point she was gathering things together before she had a hair appointment this afternoon and developed central chest pain/pressure that did not radiate anywhere. It was associated with shortness of breath. No nausea or vomiting. She does report feeling lightheaded and dizzy. Her grandson, and EMT witnessed this, as he walked back in the room and found her to be short of breath, complaining of chest pain and looking somewhat pale and diaphoretic.  She did take one dose of amlodipine which she is supposed to take if she were to experience chest pain, per her cardiologist Dr. Nehemiah Massed. She also took one nitroglycerin tablet and symptoms resolved.  Currently asymptomatic.    Past Medical History:  Diagnosis Date  . Anemia   . CAD (coronary artery disease)   . CKD (chronic kidney disease)   . Diverticulosis   . Essential thrombocytosis ([place]) 05/16/2015  . GERD (gastroesophageal reflux disease)   . GI bleed   . HTN (hypertension)   . NSTEMI (non-ST elevated myocardial infarction) ([place])   . Osteoporosis   . Thrombocytosis ([place])   . TIA (transient ischemic attack)     Patient Active Problem List   Diagnosis Date Noted  . Essential thrombocytosis ([place]) 05/16/2015  . Unstable angina ([place]) 04/22/2015  . HTN (hypertension) 04/22/2015  . GERD (gastroesophageal reflux disease) 04/22/2015  . CKD (chronic kidney disease), stage  IV ([place]) 04/22/2015  . Thrombocytosis ([place]) 04/22/2015  . History of GI bleed 04/22/2015  . CAD (coronary artery disease) 04/22/2015  . Macrocytic anemia 03/27/2015    Past Surgical History:  Procedure Laterality Date  . ABDOMINAL HYSTERECTOMY    . COLONOSCOPY      Prior to Admission medications   Medication Sig Start Date End Date Taking? Authorizing Provider  amLODipine (NORVASC) 5 MG tablet Take 5 mg by mouth daily.   Yes Historical Provider, MD  aspirin 81 MG tablet Take 81 mg by mouth daily.   Yes Historical Provider, MD  atorvastatin (LIPITOR) 10 MG tablet Take 10 mg by mouth daily.   Yes Historical Provider, MD  hydroxyurea (HYDREA) 500 MG capsule TAKE 2 CAPSULES BY MOUTH ON FRIDAYS, AND 1 CAPSULE ALL OTHER DAYS OF THE WEEK Patient taking differently: 500 mg daily. TAKE 2 CAPSULES BY MOUTH ON FRIDAYS, AND 1 CAPSULE ALL OTHER DAYS OF THE WEEK 03/27/16  Yes Cammie Sickle, MD  iron polysaccharides (NIFEREX) 150 MG capsule Take 150 mg by mouth daily.   Yes Historical Provider, MD  isosorbide mononitrate (IMDUR) 60 MG 24 hr tablet Take 1 tablet (60 mg total) by mouth daily. Patient taking differently: Take 30 mg by mouth daily.  04/22/15  Yes Loletha Grayer, MD  metoprolol succinate (TOPROL-XL) 50 MG 24 hr tablet Take 1 tablet by mouth daily. 01/29/15  Yes Historical Provider, MD  omeprazole (PRILOSEC) 20 MG capsule Take 1 capsule (20 mg total) by mouth 2 (two) times daily before a meal. 04/22/15  Yes Richard  Wieting, MD  OXYQUINOLONE SULFATE VAGINAL (TRIMO-SAN) 0.025 % GEL Place 1 application vaginally 2 (two) times a week.   Yes Historical Provider, MD  potassium chloride SA (K-DUR,KLOR-CON) 20 MEQ tablet Take 20 mEq by mouth every other day.  03/18/15  Yes Historical Provider, MD  acetaminophen (TYLENOL) 325 MG tablet Take 650 mg by mouth every 4 (four) hours as needed.     Historical Provider, MD  furosemide (LASIX) 20 MG tablet Take 20 mg by mouth every other day.    Historical  Provider, MD    Allergies  Allergen Reactions  . Alendronate Sodium Rash  . Calcitonin Rash  . Cephalexin Rash  . Raloxifene Rash  . Tramadol Rash    Family History  Problem Relation Age of Onset  . Cancer Mother   . Stroke Father     Social History Social History  Substance Use Topics  . Smoking status: Never Smoker  . Smokeless tobacco: Never Used  . Alcohol use No    Review of Systems  Constitutional: Negative for fever. Eyes: Negative for visual changes. ENT: Negative for sore throat. Cardiovascular: chest pain as per above, currently no chest pain Respiratory: Shortness of breath as per hpi, but currently no sob Gastrointestinal: Negative for abdominal pain, vomiting and diarrhea. Genitourinary: Negative for dysuria. Musculoskeletal: Negative for back pain. Skin: Negative for rash. Neurological: Negative for headache. 10 point Review of Systems otherwise negative ____________________________________________   PHYSICAL EXAM:  VITAL SIGNS: ED Triage Vitals  Enc Vitals Group     BP 09/09/16 1325 122/83     Pulse Rate 09/09/16 1325 66     Resp --      Temp 09/09/16 1325 98.6 F (37 C)     Temp Source 09/09/16 1325 Oral     SpO2 09/09/16 1325 98 %     Weight 09/09/16 1326 121 lb (54.9 kg)     Height 09/09/16 1326 5\' 1"  (1.549 m)     Head Circumference --      Peak Flow --      Pain Score --      Pain Loc --      Pain Edu? --      Excl. in [place]? --      Constitutional: Alert and oriented. Well appearing and in no distress. HEENT   Head: Normocephalic and atraumatic.      Eyes: Conjunctivae are normal. PERRL. Normal extraocular movements.      Ears:         Nose: No congestion/rhinnorhea.   Mouth/Throat: Mucous membranes are moist.   Neck: No stridor. Cardiovascular/Chest: Irregularly irregular, normal rate.  No murmurs, rubs, or gallops. Respiratory: Normal respiratory effort without tachypnea nor retractions. Breath sounds are clear and  equal bilaterally. No wheezes/rales/rhonchi. Gastrointestinal: Soft. No distention, no guarding, no rebound. Nontender.   Genitourinary/rectal:Deferred Musculoskeletal: Nontender with normal range of motion in all extremities. No joint effusions.  No lower extremity tenderness.  No edema. Neurologic:  Normal speech and language. No gross or focal neurologic deficits are appreciated. Skin:  Skin is warm, dry and intact. No rash noted. Psychiatric: Mood and affect are normal. Speech and behavior are normal. Patient exhibits appropriate insight and judgment.   ____________________________________________  LABS (pertinent positives/negatives)  Labs Reviewed  BASIC METABOLIC PANEL - Abnormal; Notable for the following:       Result Value   Glucose, Bld 118 (*)    BUN 27 (*)    Creatinine, Ser 1.24 (*)  GFR calc non Af Amer 36 (*)    GFR calc Af Amer 41 (*)    All other components within normal limits  CBC - Abnormal; Notable for the following:    RBC 2.61 (*)    Hemoglobin 10.3 (*)    HCT 29.8 (*)    MCV 114.2 (*)    MCH 39.3 (*)    RDW 14.7 (*)    All other components within normal limits  URINALYSIS, COMPLETE (UACMP) WITH MICROSCOPIC - Abnormal; Notable for the following:    Color, Urine YELLOW (*)    APPearance HAZY (*)    Hgb urine dipstick SMALL (*)    Bacteria, UA MANY (*)    Squamous Epithelial / LPF 0-5 (*)    All other components within normal limits  TROPONIN I  TROPONIN I    ____________________________________________    EKG I, Lisa Roca, MD, the attending physician have personally viewed and interpreted all ECGs.  67 bpm. Undetermined rhythm but appears to be normal sinus rhythm with occasional PVC. Nonspecific intraventricular conduction delay. Normal axis. No ST segment elevation, ST segment depression or pathologic T-wave inversion. ____________________________________________  RADIOLOGY All Xrays were viewed by me. Imaging interpreted by  Radiologist.  Chest x-ray two-view:  IMPRESSION: No acute cardiopulmonary disease.  Cardiomegaly without congestive failure.  Aortic atherosclerosis. __________________________________________  PROCEDURES  Procedure(s) performed: None  Critical Care performed: None  ____________________________________________   ED COURSE / ASSESSMENT AND PLAN  Pertinent labs & imaging results that were available during my care of the patient were reviewed by me and considered in my medical decision making (see chart for details).   Ms. Carlis Abbott is here after an episode, less than 15 minutes of central chest pain with associated shortness of breath. She has no coronary artery disease and reportedly is medically managed for 90+ percent lesions per the family and the patient.  EKG shows PVCs without acute ischemic findings. She is chest pain and symptom free now. We discussed hospital observation versus cardiac evaluation with troponin repeated at 4 hours with the possibility of going home if she continues to be asymptomatic with a reassuring workup and they would like to do this if possible.  No additional symptoms here in the emergency department during observation. Repeat troponin is negative. Patient and family are comfortable with plan of going home and following up tomorrow.    CONSULTATIONS:   Dr. Ubaldo Glassing, cardiology, okay for outpatient follow-up, appointment for 11:30 AM tomorrow.   Patient / Family / Caregiver informed of clinical course, medical decision-making process, and agree with plan.   I discussed return precautions, follow-up instructions, and discharge instructions with patient and/or family.   ___________________________________________   FINAL CLINICAL IMPRESSION(S) / ED DIAGNOSES   Final diagnoses:  Nonspecific chest pain              Note: This dictation was prepared with Dragon dictation. Any transcriptional errors that result from this process are  unintentional    Lisa Roca, MD 09/09/16 1843

## 2016-09-09 NOTE — Discharge Instructions (Signed)
You were evaluated after a chest pain episode, and although no certain cause was found, your exam and evaluation are reassuring in the emergency department today.  As we discussed, you have an appointment tomorrow at 11:30 a.m. with the cardiology office.  Return to the emergency room immediately for any new, return, or worsening chest pain, palpitations, dizziness or passing out, nausea, sweats, trouble breathing, or any other symptoms concerning to you.

## 2016-09-09 NOTE — ED Notes (Signed)
Iv dced from left ac.  D/c inst to family   Pt alert.  No chest pain

## 2017-02-12 ENCOUNTER — Other Ambulatory Visit: Payer: Medicare Other

## 2017-02-12 ENCOUNTER — Ambulatory Visit: Payer: Medicare Other | Admitting: Internal Medicine

## 2017-03-17 ENCOUNTER — Inpatient Hospital Stay: Payer: Medicare Other

## 2017-03-17 ENCOUNTER — Inpatient Hospital Stay: Payer: Medicare Other | Attending: Internal Medicine | Admitting: Internal Medicine

## 2017-03-17 VITALS — BP 165/65 | HR 67 | Temp 97.8°F | Resp 18 | Ht 61.0 in | Wt 112.4 lb

## 2017-03-17 DIAGNOSIS — D539 Nutritional anemia, unspecified: Secondary | ICD-10-CM | POA: Diagnosis not present

## 2017-03-17 DIAGNOSIS — Z79899 Other long term (current) drug therapy: Secondary | ICD-10-CM | POA: Diagnosis not present

## 2017-03-17 DIAGNOSIS — Z7982 Long term (current) use of aspirin: Secondary | ICD-10-CM | POA: Diagnosis not present

## 2017-03-17 DIAGNOSIS — Z8673 Personal history of transient ischemic attack (TIA), and cerebral infarction without residual deficits: Secondary | ICD-10-CM | POA: Diagnosis not present

## 2017-03-17 DIAGNOSIS — I2510 Atherosclerotic heart disease of native coronary artery without angina pectoris: Secondary | ICD-10-CM | POA: Insufficient documentation

## 2017-03-17 DIAGNOSIS — I129 Hypertensive chronic kidney disease with stage 1 through stage 4 chronic kidney disease, or unspecified chronic kidney disease: Secondary | ICD-10-CM

## 2017-03-17 DIAGNOSIS — I252 Old myocardial infarction: Secondary | ICD-10-CM | POA: Insufficient documentation

## 2017-03-17 DIAGNOSIS — Z8719 Personal history of other diseases of the digestive system: Secondary | ICD-10-CM | POA: Insufficient documentation

## 2017-03-17 DIAGNOSIS — M810 Age-related osteoporosis without current pathological fracture: Secondary | ICD-10-CM | POA: Diagnosis not present

## 2017-03-17 DIAGNOSIS — Z809 Family history of malignant neoplasm, unspecified: Secondary | ICD-10-CM | POA: Diagnosis not present

## 2017-03-17 DIAGNOSIS — D473 Essential (hemorrhagic) thrombocythemia: Secondary | ICD-10-CM | POA: Insufficient documentation

## 2017-03-17 DIAGNOSIS — K219 Gastro-esophageal reflux disease without esophagitis: Secondary | ICD-10-CM | POA: Diagnosis not present

## 2017-03-17 DIAGNOSIS — N189 Chronic kidney disease, unspecified: Secondary | ICD-10-CM | POA: Insufficient documentation

## 2017-03-17 LAB — CBC WITH DIFFERENTIAL/PLATELET
BASOS PCT: 1 %
Basophils Absolute: 0 10*3/uL (ref 0–0.1)
EOS ABS: 0.1 10*3/uL (ref 0–0.7)
EOS PCT: 1 %
HCT: 31.7 % — ABNORMAL LOW (ref 35.0–47.0)
Hemoglobin: 10.9 g/dL — ABNORMAL LOW (ref 12.0–16.0)
LYMPHS ABS: 0.9 10*3/uL — AB (ref 1.0–3.6)
Lymphocytes Relative: 16 %
MCH: 41.3 pg — AB (ref 26.0–34.0)
MCHC: 34.3 g/dL (ref 32.0–36.0)
MCV: 120.3 fL — ABNORMAL HIGH (ref 80.0–100.0)
Monocytes Absolute: 0.4 10*3/uL (ref 0.2–0.9)
Monocytes Relative: 8 %
Neutro Abs: 3.9 10*3/uL (ref 1.4–6.5)
Neutrophils Relative %: 74 %
PLATELETS: 442 10*3/uL — AB (ref 150–440)
RBC: 2.64 MIL/uL — ABNORMAL LOW (ref 3.80–5.20)
RDW: 13.7 % (ref 11.5–14.5)
WBC: 5.3 10*3/uL (ref 3.6–11.0)

## 2017-03-17 LAB — COMPREHENSIVE METABOLIC PANEL
ALK PHOS: 64 U/L (ref 38–126)
ALT: 12 U/L — AB (ref 14–54)
AST: 21 U/L (ref 15–41)
Albumin: 4.2 g/dL (ref 3.5–5.0)
Anion gap: 6 (ref 5–15)
BILIRUBIN TOTAL: 0.7 mg/dL (ref 0.3–1.2)
BUN: 29 mg/dL — AB (ref 6–20)
CALCIUM: 9.6 mg/dL (ref 8.9–10.3)
CO2: 24 mmol/L (ref 22–32)
CREATININE: 1.43 mg/dL — AB (ref 0.44–1.00)
Chloride: 108 mmol/L (ref 101–111)
GFR, EST AFRICAN AMERICAN: 35 mL/min — AB (ref >60)
GFR, EST NON AFRICAN AMERICAN: 30 mL/min — AB (ref >60)
Glucose, Bld: 118 mg/dL — ABNORMAL HIGH (ref 65–99)
Potassium: 4.2 mmol/L (ref 3.5–5.1)
Sodium: 138 mmol/L (ref 135–145)
TOTAL PROTEIN: 7.4 g/dL (ref 6.5–8.1)

## 2017-03-17 MED ORDER — HYDROXYUREA 500 MG PO CAPS: 500.0000 mg | ORAL_CAPSULE | Freq: Every day | ORAL | 3 refills | Status: DC

## 2017-03-17 NOTE — Progress Notes (Signed)
[place]  Telephone:(336) [phone]  [phone]) [phone]     KARN DERK DOB: [birth year]  MR#: [id number]  RSW#:[id number]  Patient Care Team: Dion Body, MD as PCP - General (Family Medicine)  CHIEF COMPLAINT:  Chief Complaint  Patient presents with  . thrombocytosis    # ESSENTIAL THROMBOCYTOSIS [Jak-2-NEG; Bcr-abl-NEG] on Hydrea 500 day.   INTERVAL HISTORY:   83 year old female patient with above history of essential thrombocytosis is here for follow-up. Patient is on Hydrea 500 mg a day. She is also on aspirin.   Denies any skin rash. Denies any blood clots. Denies any burning pain feet and hands. No weight loss or night sweats.  ROS: A complete 10 point review of system is done which is negative for mentioned above in history of present illness   REVIEW OF SYSTEMS:   Review of Systems  Constitutional: Negative for chills, diaphoresis, fever, malaise/fatigue and weight loss.  HENT: Negative for congestion, ear discharge, ear pain, hearing loss, nosebleeds, sore throat and tinnitus.   Eyes: Negative for blurred vision, double vision, photophobia, pain, discharge and redness.  Respiratory: Negative for cough, hemoptysis, sputum production, shortness of breath, wheezing and stridor.   Cardiovascular: Negative for chest pain, palpitations, orthopnea, claudication, leg swelling and PND.  Gastrointestinal: Negative for abdominal pain, blood in stool, constipation, diarrhea, heartburn, melena, nausea and vomiting.  Genitourinary: Negative.   Musculoskeletal: Negative.   Skin: Negative.   Neurological: Negative for dizziness, tingling, focal weakness, seizures, weakness and headaches.  Endo/Heme/Allergies: Does not bruise/bleed easily.  Psychiatric/Behavioral: Negative for depression. The patient is not nervous/anxious and does not have insomnia.     As per HPI. Otherwise, a complete review of systems is negatve.  ONCOLOGY HISTORY:  No history exists.      PAST MEDICAL HISTORY: Past Medical History:  Diagnosis Date  . Anemia   . CAD (coronary artery disease)   . CKD (chronic kidney disease)   . Diverticulosis   . Essential thrombocytosis ([place]) 05/16/2015  . GERD (gastroesophageal reflux disease)   . GI bleed   . HTN (hypertension)   . NSTEMI (non-ST elevated myocardial infarction) ([place])   . Osteoporosis   . Thrombocytosis ([place])   . TIA (transient ischemic attack)     PAST SURGICAL HISTORY: Past Surgical History:  Procedure Laterality Date  . ABDOMINAL HYSTERECTOMY    . COLONOSCOPY      FAMILY HISTORY Family History  Problem Relation Age of Onset  . Cancer Mother   . Stroke Father     GYNECOLOGIC HISTORY:  No LMP recorded. Patient has had a hysterectomy.     ADVANCED DIRECTIVES:    HEALTH MAINTENANCE: Social History  Substance Use Topics  . Smoking status: Never Smoker  . Smokeless tobacco: Never Used  . Alcohol use No     Allergies  Allergen Reactions  . Alendronate Sodium Rash  . Calcitonin Rash  . Cephalexin Rash  . Raloxifene Rash  . Tramadol Rash    Current Outpatient Prescriptions  Medication Sig Dispense Refill  . acetaminophen (TYLENOL) 325 MG tablet Take 650 mg by mouth every 4 (four) hours as needed.     Marland Kitchen amLODipine (NORVASC) 5 MG tablet Take 5 mg by mouth daily.    Marland Kitchen aspirin 81 MG tablet Take 81 mg by mouth daily.    Marland Kitchen atorvastatin (LIPITOR) 10 MG tablet Take 10 mg by mouth daily.    . furosemide (LASIX) 20 MG tablet Take 20 mg by mouth every other day.    Marland Kitchen  hydroxyurea (HYDREA) 500 MG capsule Take 1 capsule (500 mg total) by mouth daily. 90 capsule 3  . iron polysaccharides (NIFEREX) 150 MG capsule Take 150 mg by mouth daily.    . isosorbide mononitrate (IMDUR) 60 MG 24 hr tablet Take 1 tablet (60 mg total) by mouth daily. (Patient taking differently: Take 30 mg by mouth daily. ) 30 tablet 0  . metoprolol succinate (TOPROL-XL) 50 MG 24 hr tablet Take 1 tablet by mouth daily.    Marland Kitchen  omeprazole (PRILOSEC) 20 MG capsule Take 1 capsule (20 mg total) by mouth 2 (two) times daily before a meal.    . potassium chloride SA (K-DUR,KLOR-CON) 20 MEQ tablet Take 20 mEq by mouth every other day.     . vitamin B-12 (CYANOCOBALAMIN) 1000 MCG tablet Take 1,000 mcg by mouth daily.     No current facility-administered medications for this visit.     OBJECTIVE: BP (!) 165/65 (BP Location: Right Arm, Patient Position: Sitting)   Pulse 67   Temp 97.8 F (36.6 C) (Tympanic)   Resp 18   Ht 5' 1"  (1.549 m)   Wt 112 lb 6.4 oz (51 kg)   BMI 21.24 kg/m    Body mass index is 21.24 kg/m.    ECOG FS:0 - Asymptomatic  General: Well-developed, well-nourished, no acute distress. Independently ambulatory. Accompanied by her son. Eyes: No pallor or ecterus. HEENT: Normocephalic, moist mucous membranes, clear oropharnyx. Lungs: Clear to auscultation bilaterally. Heart: Regular rate and rhythm. No rubs, murmurs, or gallops. Abdomen: Soft, nontender, nondistended.  Musculoskeletal: No edema, cyanosis, or clubbing. Neuro: Alert, answering all questions appropriately.  Skin: No rashes or lesions Psych: Normal affect.   LAB RESULTS:  Appointment on 03/17/2017  Component Date Value Ref Range Status  . Sodium 03/17/2017 138  135 - 145 mmol/L Final  . Potassium 03/17/2017 4.2  3.5 - 5.1 mmol/L Final  . Chloride 03/17/2017 108  101 - 111 mmol/L Final  . CO2 03/17/2017 24  22 - 32 mmol/L Final  . Glucose, Bld 03/17/2017 118* 65 - 99 mg/dL Final  . BUN 03/17/2017 29* 6 - 20 mg/dL Final  . Creatinine, Ser 03/17/2017 1.43* 0.44 - 1.00 mg/dL Final  . Calcium 03/17/2017 9.6  8.9 - 10.3 mg/dL Final  . Total Protein 03/17/2017 7.4  6.5 - 8.1 g/dL Final  . Albumin 03/17/2017 4.2  3.5 - 5.0 g/dL Final  . AST 03/17/2017 21  15 - 41 U/L Final  . ALT 03/17/2017 12* 14 - 54 U/L Final  . Alkaline Phosphatase 03/17/2017 64  38 - 126 U/L Final  . Total Bilirubin 03/17/2017 0.7  0.3 - 1.2 mg/dL Final  . GFR  calc non Af Amer 03/17/2017 30* >60 mL/min Final  . GFR calc Af Amer 03/17/2017 35* >60 mL/min Final   Comment: (NOTE) The eGFR has been calculated using the CKD EPI equation. This calculation has not been validated in all clinical situations. eGFR's persistently <60 mL/min signify possible Chronic Kidney Disease.   . Anion gap 03/17/2017 6  5 - 15 Final  . WBC 03/17/2017 5.3  3.6 - 11.0 K/uL Final  . RBC 03/17/2017 2.64* 3.80 - 5.20 MIL/uL Final  . Hemoglobin 03/17/2017 10.9* 12.0 - 16.0 g/dL Final  . HCT 03/17/2017 31.7* 35.0 - 47.0 % Final  . MCV 03/17/2017 120.3* 80.0 - 100.0 fL Final  . MCH 03/17/2017 41.3* 26.0 - 34.0 pg Final  . MCHC 03/17/2017 34.3  32.0 - 36.0 g/dL Final  .  RDW 03/17/2017 13.7  11.5 - 14.5 % Final  . Platelets 03/17/2017 442* 150 - 440 K/uL Final  . Neutrophils Relative % 03/17/2017 74  % Final  . Neutro Abs 03/17/2017 3.9  1.4 - 6.5 K/uL Final  . Lymphocytes Relative 03/17/2017 16  % Final  . Lymphs Abs 03/17/2017 0.9* 1.0 - 3.6 K/uL Final  . Monocytes Relative 03/17/2017 8  % Final  . Monocytes Absolute 03/17/2017 0.4  0.2 - 0.9 K/uL Final  . Eosinophils Relative 03/17/2017 1  % Final  . Eosinophils Absolute 03/17/2017 0.1  0 - 0.7 K/uL Final  . Basophils Relative 03/17/2017 1  % Final  . Basophils Absolute 03/17/2017 0.0  0 - 0.1 K/uL Final    STUDIES: No results found.  ASSESSMENT:  Essential thrombocytosis  Essential thrombocytosis ([place]) # ESSENTIAL THROMBOCYTOSIS-  Jack 2 negative;  On Hydrea 500 mg once a day/aspirin.  Patient tolerating therapy very well. Patient's platelets are slightly elevated around 442. Continue current regimen.   #  Anemia with macrocytosis-  Hemoglobin 10.5-  Likely from  Hydrea. Stable. Refilled her Hydrea prescription.  # CKD- creatinine 1.4/ continue follow up with PCP.   #  Recommend follow-up in 6 months/ CBC CMP.     Cammie Sickle, MD   03/17/2017 11:46 AM

## 2017-03-17 NOTE — Assessment & Plan Note (Addendum)
#   ESSENTIAL THROMBOCYTOSIS-  [name] 2 negative;  On Hydrea 500 mg once a day/aspirin.  Patient tolerating therapy very well. Patient's platelets are slightly elevated around 442. Continue current regimen.   #  Anemia with macrocytosis-  Hemoglobin 10.5-  Likely from  Hydrea. Stable. Refilled her Hydrea prescription.  # CKD- creatinine 1.4/ continue follow up with PCP.   #  Recommend follow-up in 6 months/ CBC CMP.

## 2017-05-03 ENCOUNTER — Other Ambulatory Visit: Payer: Self-pay | Admitting: Internal Medicine

## 2017-05-03 DIAGNOSIS — D649 Anemia, unspecified: Secondary | ICD-10-CM

## 2017-08-23 ENCOUNTER — Other Ambulatory Visit: Payer: Self-pay | Admitting: Internal Medicine

## 2017-08-23 DIAGNOSIS — D649 Anemia, unspecified: Secondary | ICD-10-CM

## 2017-09-20 ENCOUNTER — Inpatient Hospital Stay: Payer: Medicare Other | Attending: Internal Medicine | Admitting: Internal Medicine

## 2017-09-20 ENCOUNTER — Inpatient Hospital Stay: Payer: Medicare Other

## 2017-09-20 VITALS — BP 187/67 | HR 55 | Temp 97.9°F | Resp 20

## 2017-09-20 DIAGNOSIS — N189 Chronic kidney disease, unspecified: Secondary | ICD-10-CM | POA: Insufficient documentation

## 2017-09-20 DIAGNOSIS — Z8673 Personal history of transient ischemic attack (TIA), and cerebral infarction without residual deficits: Secondary | ICD-10-CM | POA: Diagnosis not present

## 2017-09-20 DIAGNOSIS — I2510 Atherosclerotic heart disease of native coronary artery without angina pectoris: Secondary | ICD-10-CM | POA: Diagnosis not present

## 2017-09-20 DIAGNOSIS — D473 Essential (hemorrhagic) thrombocythemia: Secondary | ICD-10-CM

## 2017-09-20 DIAGNOSIS — Z7982 Long term (current) use of aspirin: Secondary | ICD-10-CM | POA: Diagnosis not present

## 2017-09-20 DIAGNOSIS — I129 Hypertensive chronic kidney disease with stage 1 through stage 4 chronic kidney disease, or unspecified chronic kidney disease: Secondary | ICD-10-CM | POA: Insufficient documentation

## 2017-09-20 DIAGNOSIS — D539 Nutritional anemia, unspecified: Secondary | ICD-10-CM | POA: Insufficient documentation

## 2017-09-20 DIAGNOSIS — K219 Gastro-esophageal reflux disease without esophagitis: Secondary | ICD-10-CM | POA: Diagnosis not present

## 2017-09-20 DIAGNOSIS — I252 Old myocardial infarction: Secondary | ICD-10-CM | POA: Diagnosis not present

## 2017-09-20 DIAGNOSIS — Z79899 Other long term (current) drug therapy: Secondary | ICD-10-CM | POA: Diagnosis not present

## 2017-09-20 LAB — BASIC METABOLIC PANEL
ANION GAP: 9 (ref 5–15)
BUN: 24 mg/dL — ABNORMAL HIGH (ref 6–20)
CHLORIDE: 105 mmol/L (ref 101–111)
CO2: 25 mmol/L (ref 22–32)
Calcium: 9.4 mg/dL (ref 8.9–10.3)
Creatinine, Ser: 1.11 mg/dL — ABNORMAL HIGH (ref 0.44–1.00)
GFR, EST AFRICAN AMERICAN: 47 mL/min — AB (ref >60)
GFR, EST NON AFRICAN AMERICAN: 41 mL/min — AB (ref >60)
Glucose, Bld: 116 mg/dL — ABNORMAL HIGH (ref 65–99)
POTASSIUM: 4.2 mmol/L (ref 3.5–5.1)
SODIUM: 139 mmol/L (ref 135–145)

## 2017-09-20 LAB — CBC WITH DIFFERENTIAL/PLATELET
BASOS ABS: 0.1 10*3/uL (ref 0–0.1)
Basophils Relative: 3 %
Eosinophils Absolute: 0.1 10*3/uL (ref 0–0.7)
Eosinophils Relative: 2 %
HCT: 30.7 % — ABNORMAL LOW (ref 35.0–47.0)
HEMOGLOBIN: 10.1 g/dL — AB (ref 12.0–16.0)
LYMPHS ABS: 0.9 10*3/uL — AB (ref 1.0–3.6)
LYMPHS PCT: 21 %
MCH: 40.4 pg — AB (ref 26.0–34.0)
MCHC: 33 g/dL (ref 32.0–36.0)
MCV: 122.5 fL — AB (ref 80.0–100.0)
Monocytes Absolute: 0.2 10*3/uL (ref 0.2–0.9)
Monocytes Relative: 4 %
NEUTROS PCT: 70 %
Neutro Abs: 3 10*3/uL (ref 1.4–6.5)
Platelets: 363 10*3/uL (ref 150–440)
RBC: 2.5 MIL/uL — AB (ref 3.80–5.20)
RDW: 12.9 % (ref 11.5–14.5)
WBC: 4.3 10*3/uL (ref 3.6–11.0)

## 2017-09-20 NOTE — Assessment & Plan Note (Addendum)
#   ESSENTIAL THROMBOCYTOSIS-  [name] 2 negative;  On Hydrea 500 mg once a day/aspirin.  Patient tolerating therapy very well. Continue current regimen.   #  Anemia with macrocytosis-  Hemoglobin 10.5-  Likely from  Hydrea. Stable. Refilled her Hydrea prescription.  # elevated BP- not elevated at home;   # CKD- creatinine 1.1/ continue follow up with PCP.   #  Recommend follow-up in 6 months/ CBC CMP.

## 2017-09-20 NOTE — Progress Notes (Signed)
[place]  Telephone:(336) [phone]  Fax:(336) [phone]     [name] DOB: [birth year]  MR#: [id number]  BMW#:[id number]  Patient Care Team: Dion Body, MD as PCP - General (Family Medicine)  CHIEF COMPLAINT:  Chief Complaint  Patient presents with  . thrombocytosis    # ESSENTIAL THROMBOCYTOSIS [Jak-2-NEG; Bcr-abl-NEG] on Hydrea 500 day.   INTERVAL HISTORY:   84 year old female patient with above history of essential thrombocytosis is here for follow-up. Patient is on Hydrea 500 mg a day. She is also on aspirin.   Denies any burning pain feet and hands. No weight loss or night sweats. Denies any skin rash. Denies any blood clots.   ROS: A complete 10 point review of system is done which is negative for mentioned above in history of present illness  REVIEW OF SYSTEMS:   Review of Systems  Constitutional: Negative for chills, diaphoresis, fever, malaise/fatigue and weight loss.  HENT: Negative for congestion, ear discharge, ear pain, hearing loss, nosebleeds, sore throat and tinnitus.   Eyes: Negative for blurred vision, double vision, photophobia, pain, discharge and redness.  Respiratory: Negative for cough, hemoptysis, sputum production, shortness of breath, wheezing and stridor.   Cardiovascular: Negative for chest pain, palpitations, orthopnea, claudication, leg swelling and PND.  Gastrointestinal: Negative for abdominal pain, blood in stool, constipation, diarrhea, heartburn, melena, nausea and vomiting.  Genitourinary: Negative.   Musculoskeletal: Negative.   Skin: Negative.   Neurological: Negative for dizziness, tingling, focal weakness, seizures, weakness and headaches.  Endo/Heme/Allergies: Does not bruise/bleed easily.  Psychiatric/Behavioral: Negative for depression. The patient is not nervous/anxious and does not have insomnia.     As per HPI. Otherwise, a complete review of systems is negatve.  ONCOLOGY HISTORY:  No history exists.     PAST MEDICAL HISTORY: Past Medical History:  Diagnosis Date  . Anemia   . CAD (coronary artery disease)   . CKD (chronic kidney disease)   . Diverticulosis   . Essential thrombocytosis ([place]) 05/16/2015  . GERD (gastroesophageal reflux disease)   . GI bleed   . HTN (hypertension)   . NSTEMI (non-ST elevated myocardial infarction) ([place])   . Osteoporosis   . Thrombocytosis ([place])   . TIA (transient ischemic attack)     PAST SURGICAL HISTORY: Past Surgical History:  Procedure Laterality Date  . ABDOMINAL HYSTERECTOMY    . COLONOSCOPY      FAMILY HISTORY Family History  Problem Relation Age of Onset  . Cancer Mother   . Stroke Father     GYNECOLOGIC HISTORY:  No LMP recorded. Patient has had a hysterectomy.     ADVANCED DIRECTIVES:    HEALTH MAINTENANCE: Social History   Tobacco Use  . Smoking status: Never Smoker  . Smokeless tobacco: Never Used  Substance Use Topics  . Alcohol use: No  . Drug use: No     Allergies  Allergen Reactions  . Alendronate Sodium Rash  . Calcitonin Rash  . Cephalexin Rash  . Raloxifene Rash  . Tramadol Rash    Current Outpatient Medications  Medication Sig Dispense Refill  . acetaminophen (TYLENOL) 325 MG tablet Take 650 mg by mouth every 4 (four) hours as needed.     Marland Kitchen amLODipine (NORVASC) 5 MG tablet Take 5 mg by mouth daily.    Marland Kitchen aspirin 81 MG tablet Take 81 mg by mouth daily.    Marland Kitchen atorvastatin (LIPITOR) 10 MG tablet Take 10 mg by mouth daily.    . furosemide (LASIX) 20 MG tablet  Take 20 mg by mouth every other day.    . hydroxyurea (HYDREA) 500 MG capsule Take 1 capsule (500 mg total) by mouth daily. 90 capsule 3  . iron polysaccharides (NIFEREX) 150 MG capsule Take 150 mg by mouth daily.    . isosorbide mononitrate (IMDUR) 60 MG 24 hr tablet Take 1 tablet (60 mg total) by mouth daily. (Patient taking differently: Take 30 mg by mouth daily. ) 30 tablet 0  . metoprolol succinate (TOPROL-XL) 50 MG 24 hr tablet Take 1  tablet by mouth daily.    Marland Kitchen omeprazole (PRILOSEC) 20 MG capsule Take 1 capsule (20 mg total) by mouth 2 (two) times daily before a meal.    . potassium chloride SA (K-DUR,KLOR-CON) 20 MEQ tablet Take 20 mEq by mouth every other day.     . vitamin B-12 (CYANOCOBALAMIN) 1000 MCG tablet Take 1,000 mcg by mouth daily.     No current facility-administered medications for this visit.     OBJECTIVE: BP (!) 187/67   Pulse (!) 55   Temp 97.9 F (36.6 C) (Tympanic)   Resp 20    There is no height or weight on file to calculate BMI.    ECOG FS:0 - Asymptomatic  General: Well-developed, well-nourished, no acute distress. Independently ambulatory. Accompanied by her grand-son. Eyes: No pallor or ecterus. HEENT: Normocephalic, moist mucous membranes, clear oropharnyx. Lungs: Clear to auscultation bilaterally. Heart: Regular rate and rhythm. No rubs, murmurs, or gallops. Abdomen: Soft, nontender, nondistended.  Musculoskeletal: No edema, cyanosis, or clubbing. Neuro: Alert, answering all questions appropriately.  Skin: No rashes or lesions Psych: Normal affect.   LAB RESULTS:  Appointment on 09/20/2017  Component Date Value Ref Range Status  . Sodium 09/20/2017 139  135 - 145 mmol/L Final  . Potassium 09/20/2017 4.2  3.5 - 5.1 mmol/L Final  . Chloride 09/20/2017 105  101 - 111 mmol/L Final  . CO2 09/20/2017 25  22 - 32 mmol/L Final  . Glucose, Bld 09/20/2017 116* 65 - 99 mg/dL Final  . BUN 09/20/2017 24* 6 - 20 mg/dL Final  . Creatinine, Ser 09/20/2017 1.11* 0.44 - 1.00 mg/dL Final  . Calcium 09/20/2017 9.4  8.9 - 10.3 mg/dL Final  . GFR calc non Af Amer 09/20/2017 41* >60 mL/min Final  . GFR calc Af Amer 09/20/2017 47* >60 mL/min Final   Comment: (NOTE) The eGFR has been calculated using the CKD EPI equation. This calculation has not been validated in all clinical situations. eGFR's persistently <60 mL/min signify possible Chronic Kidney Disease.   Georgiann Hahn gap 09/20/2017 9  5 - 15  Final   Performed at [facility], [address] [place], [place] [address]  . WBC 09/20/2017 4.3  3.6 - 11.0 K/uL Final  . RBC 09/20/2017 2.50* 3.80 - 5.20 MIL/uL Final  . Hemoglobin 09/20/2017 10.1* 12.0 - 16.0 g/dL Final  . HCT 09/20/2017 30.7* 35.0 - 47.0 % Final  . MCV 09/20/2017 122.5* 80.0 - 100.0 fL Final  . MCH 09/20/2017 40.4* 26.0 - 34.0 pg Final  . MCHC 09/20/2017 33.0  32.0 - 36.0 g/dL Final  . RDW 09/20/2017 12.9  11.5 - 14.5 % Final  . Platelets 09/20/2017 363  150 - 440 K/uL Final  . Neutrophils Relative % 09/20/2017 70  % Final  . Neutro Abs 09/20/2017 3.0  1.4 - 6.5 K/uL Final  . Lymphocytes Relative 09/20/2017 21  % Final  . Lymphs Abs 09/20/2017 0.9* 1.0 - 3.6 K/uL Final  .  Monocytes Relative 09/20/2017 4  % Final  . Monocytes Absolute 09/20/2017 0.2  0.2 - 0.9 K/uL Final  . Eosinophils Relative 09/20/2017 2  % Final  . Eosinophils Absolute 09/20/2017 0.1  0 - 0.7 K/uL Final  . Basophils Relative 09/20/2017 3  % Final  . Basophils Absolute 09/20/2017 0.1  0 - 0.1 K/uL Final   Performed at [facility], [address] [place], [place] [address]    STUDIES: No results found.  ASSESSMENT:  Essential thrombocytosis  Essential thrombocytosis ([place]) # ESSENTIAL THROMBOCYTOSIS-  Jack 2 negative;  On Hydrea 500 mg once a day/aspirin.  Patient tolerating therapy very well. Continue current regimen.   #  Anemia with macrocytosis-  Hemoglobin 10.5-  Likely from  Hydrea. Stable. Refilled her Hydrea prescription.  # elevated BP- not elevated at home;   # CKD- creatinine 1.1/ continue follow up with PCP.   #  Recommend follow-up in 6 months/ CBC CMP.   Cammie Sickle, MD   09/20/2017 4:05 PM

## 2017-11-29 ENCOUNTER — Encounter: Payer: Self-pay | Admitting: Emergency Medicine

## 2017-11-29 ENCOUNTER — Other Ambulatory Visit: Payer: Self-pay

## 2017-11-29 ENCOUNTER — Emergency Department
Admission: EM | Admit: 2017-11-29 | Discharge: 2017-11-29 | Disposition: A | Discharge status: Home / Self Care | Payer: Medicare Other | Attending: Emergency Medicine | Admitting: Emergency Medicine

## 2017-11-29 ENCOUNTER — Emergency Department: Payer: Medicare Other

## 2017-11-29 ENCOUNTER — Other Ambulatory Visit: Payer: Self-pay | Admitting: Internal Medicine

## 2017-11-29 DIAGNOSIS — Z79899 Other long term (current) drug therapy: Secondary | ICD-10-CM | POA: Diagnosis not present

## 2017-11-29 DIAGNOSIS — R0789 Other chest pain: Secondary | ICD-10-CM | POA: Insufficient documentation

## 2017-11-29 DIAGNOSIS — N184 Chronic kidney disease, stage 4 (severe): Secondary | ICD-10-CM | POA: Diagnosis not present

## 2017-11-29 DIAGNOSIS — I129 Hypertensive chronic kidney disease with stage 1 through stage 4 chronic kidney disease, or unspecified chronic kidney disease: Secondary | ICD-10-CM | POA: Diagnosis not present

## 2017-11-29 DIAGNOSIS — Z7982 Long term (current) use of aspirin: Secondary | ICD-10-CM | POA: Insufficient documentation

## 2017-11-29 DIAGNOSIS — D649 Anemia, unspecified: Secondary | ICD-10-CM

## 2017-11-29 DIAGNOSIS — I2510 Atherosclerotic heart disease of native coronary artery without angina pectoris: Secondary | ICD-10-CM | POA: Insufficient documentation

## 2017-11-29 DIAGNOSIS — Z8673 Personal history of transient ischemic attack (TIA), and cerebral infarction without residual deficits: Secondary | ICD-10-CM | POA: Insufficient documentation

## 2017-11-29 DIAGNOSIS — I252 Old myocardial infarction: Secondary | ICD-10-CM | POA: Insufficient documentation

## 2017-11-29 LAB — URINALYSIS, COMPLETE (UACMP) WITH MICROSCOPIC
BILIRUBIN URINE: NEGATIVE
Bacteria, UA: NONE SEEN
Glucose, UA: NEGATIVE mg/dL
HGB URINE DIPSTICK: NEGATIVE
KETONES UR: NEGATIVE mg/dL
LEUKOCYTES UA: NEGATIVE
Nitrite: NEGATIVE
PROTEIN: NEGATIVE mg/dL
Specific Gravity, Urine: 1.005 (ref 1.005–1.030)
pH: 6 (ref 5.0–8.0)

## 2017-11-29 LAB — CBC
HEMATOCRIT: 32.2 % — AB (ref 35.0–47.0)
HEMOGLOBIN: 10.8 g/dL — AB (ref 12.0–16.0)
MCH: 40.4 pg — AB (ref 26.0–34.0)
MCHC: 33.6 g/dL (ref 32.0–36.0)
MCV: 120.2 fL — AB (ref 80.0–100.0)
Platelets: 391 10*3/uL (ref 150–440)
RBC: 2.68 MIL/uL — ABNORMAL LOW (ref 3.80–5.20)
RDW: 13.7 % (ref 11.5–14.5)
WBC: 3.6 10*3/uL (ref 3.6–11.0)

## 2017-11-29 LAB — TROPONIN I: Troponin I: <0.03 ng/mL (ref <0.03)

## 2017-11-29 LAB — BASIC METABOLIC PANEL
ANION GAP: 8 (ref 5–15)
BUN: 22 mg/dL — ABNORMAL HIGH (ref 6–20)
CHLORIDE: 104 mmol/L (ref 101–111)
CO2: 28 mmol/L (ref 22–32)
Calcium: 9.5 mg/dL (ref 8.9–10.3)
Creatinine, Ser: 1.2 mg/dL — ABNORMAL HIGH (ref 0.44–1.00)
GFR calc non Af Amer: 37 mL/min — ABNORMAL LOW (ref >60)
GFR, EST AFRICAN AMERICAN: 43 mL/min — AB (ref >60)
GLUCOSE: 163 mg/dL — AB (ref 65–99)
POTASSIUM: 3.6 mmol/L (ref 3.5–5.1)
Sodium: 140 mmol/L (ref 135–145)

## 2017-11-29 NOTE — ED Notes (Signed)
This RN confirmed lab had urine at this time. RN will monitor.

## 2017-11-29 NOTE — Discharge Instructions (Addendum)
Continue to take your regular medications as prescribed.   Call Dr. Alveria Apley office to arrange for follow-up.  Return to the emergency department for new, worsening, or persistent chest pain, difficulty breathing, weakness or lightheadedness, passing out again, or any other new or worsening symptoms that concern you.

## 2017-11-29 NOTE — ED Provider Notes (Signed)
[facility] Emergency Department Provider Note ____________________________________________   First MD Initiated Contact with Patient 11/29/17 1331     (approximate)  I have reviewed the triage vital signs and the nursing notes.   HISTORY  Chief Complaint Chest Pain    HPI [name] is a 84 y.o. female with past medical history as noted below who presents with chest pain, acute onset earlier today, described as sharp, and now resolved.  No associated shortness of breath, nausea, or lightheadedness.  No leg pain or swelling.  Per her grandson, the patient felt somewhat dizzy yesterday morning and then had a brief episode of syncope with prodrome of lightheadedness.  She has otherwise been in her usual state of health, and was asymptomatic from after the syncope until today.  Past Medical History:  Diagnosis Date  . Anemia   . CAD (coronary artery disease)   . CKD (chronic kidney disease)   . Diverticulosis   . Essential thrombocytosis ([place]) 05/16/2015  . GERD (gastroesophageal reflux disease)   . GI bleed   . HTN (hypertension)   . NSTEMI (non-ST elevated myocardial infarction) ([place])   . Osteoporosis   . Thrombocytosis ([place])   . TIA (transient ischemic attack)     Patient Active Problem List   Diagnosis Date Noted  . Essential thrombocytosis ([place]) 05/16/2015  . Unstable angina ([place]) 04/22/2015  . HTN (hypertension) 04/22/2015  . GERD (gastroesophageal reflux disease) 04/22/2015  . CKD (chronic kidney disease), stage IV ([place]) 04/22/2015  . Thrombocytosis ([place]) 04/22/2015  . History of GI bleed 04/22/2015  . CAD (coronary artery disease) 04/22/2015  . Macrocytic anemia 03/27/2015    Past Surgical History:  Procedure Laterality Date  . ABDOMINAL HYSTERECTOMY    . COLONOSCOPY      Prior to Admission medications   Medication Sig Start Date End Date Taking? Authorizing Provider  acetaminophen (TYLENOL) 325 MG tablet Take 650 mg by mouth  every 4 (four) hours as needed.     [provider]  amLODipine (NORVASC) 5 MG tablet Take 5 mg by mouth daily.    [provider]  aspirin 81 MG tablet Take 81 mg by mouth daily.    [provider]  atorvastatin (LIPITOR) 10 MG tablet Take 10 mg by mouth daily.    [provider]  furosemide (LASIX) 20 MG tablet Take 20 mg by mouth every other day.    [provider]  hydroxyurea (HYDREA) 500 MG capsule Take 1 capsule (500 mg total) by mouth daily. 03/17/17   Cammie Sickle, MD  hydroxyurea (HYDREA) 500 MG capsule TAKE 2 CAPSULES BY MOUTH ON FRIDAY, AND 1 CAPSULE ON THE OTHER 6 DAYS OF THE WEEK 11/29/17   Cammie Sickle, MD  iron polysaccharides (NIFEREX) 150 MG capsule Take 150 mg by mouth daily.    [provider]  isosorbide mononitrate (IMDUR) 60 MG 24 hr tablet Take 1 tablet (60 mg total) by mouth daily. Patient taking differently: Take 30 mg by mouth daily.  04/22/15   Loletha Grayer, MD  metoprolol succinate (TOPROL-XL) 50 MG 24 hr tablet Take 1 tablet by mouth daily. 01/29/15   [provider]  omeprazole (PRILOSEC) 20 MG capsule Take 1 capsule (20 mg total) by mouth 2 (two) times daily before a meal. 04/22/15   Leslye Peer, Richard, MD  potassium chloride SA (K-DUR,KLOR-CON) 20 MEQ tablet Take 20 mEq by mouth every other day.  03/18/15   [provider]  vitamin  B-12 (CYANOCOBALAMIN) 1000 MCG tablet Take 1,000 mcg by mouth daily.    [provider]    Allergies Alendronate sodium; Calcitonin; Cephalexin; Raloxifene; and Tramadol  Family History  Problem Relation Age of Onset  . Cancer Mother   . Stroke Father     Social History Social History   Tobacco Use  . Smoking status: Never Smoker  . Smokeless tobacco: Never Used  Substance Use Topics  . Alcohol use: No  . Drug use: No    Review of Systems  Constitutional: No fever. Eyes: No redness. ENT: No sore throat. Cardiovascular:  Positive for chest pain. Respiratory: Denies shortness of breath. Gastrointestinal: No nausea, no vomiting.   Genitourinary: Negative for flank pain.  Musculoskeletal: Negative for back pain. Skin: Negative for rash. Neurological: Negative for headache.   ____________________________________________   PHYSICAL EXAM:  VITAL SIGNS: ED Triage Vitals  Enc Vitals Group     BP 11/29/17 1236 (!) 182/57     Pulse Rate 11/29/17 1236 69     Resp 11/29/17 1236 16     Temp 11/29/17 1236 99 F (37.2 C)     Temp Source 11/29/17 1236 Oral     SpO2 11/29/17 1236 97 %     Weight 11/29/17 1220 105 lb (47.6 kg)     Height 11/29/17 1220 5\' 2"  (1.575 m)     Head Circumference --      Peak Flow --      Pain Score 11/29/17 1220 5     Pain Loc --      Pain Edu? --      Excl. in [place]? --     Constitutional: Alert and oriented. Well appearing for age and in no acute distress. Eyes: Conjunctivae are normal.  EOMI.  PERRLA. Head: Atraumatic. Nose: No congestion/rhinnorhea. Mouth/Throat: Mucous membranes are moist.   Neck: Normal range of motion.  Cardiovascular: Normal rate, regular rhythm. Grossly normal heart sounds.  Good peripheral circulation.  Chest wall nontender. Respiratory: Normal respiratory effort.  No retractions. Lungs CTAB. Gastrointestinal: Soft and nontender. No distention.  Genitourinary: No flank tenderness. Musculoskeletal: No lower extremity edema.  Extremities warm and well perfused.  Neurologic:  Normal speech and language. No gross focal neurologic deficits are appreciated.  Skin:  Skin is warm and dry. No rash noted. Psychiatric: Mood and affect are normal. Speech and behavior are normal.  ____________________________________________   LABS (all labs ordered are listed, but only abnormal results are displayed)  Labs Reviewed  BASIC METABOLIC PANEL - Abnormal; Notable for the following components:      Result Value   Glucose, Bld 163 (*)    BUN 22 (*)     Creatinine, Ser 1.20 (*)    GFR calc non Af Amer 37 (*)    GFR calc Af Amer 43 (*)    All other components within normal limits  CBC - Abnormal; Notable for the following components:   RBC 2.68 (*)    Hemoglobin 10.8 (*)    HCT 32.2 (*)    MCV 120.2 (*)    MCH 40.4 (*)    All other components within normal limits  URINALYSIS, COMPLETE (UACMP) WITH MICROSCOPIC - Abnormal; Notable for the following components:   Color, Urine COLORLESS (*)    APPearance CLEAR (*)    Squamous Epithelial / LPF 0-5 (*)    All other components within normal limits  TROPONIN I  TROPONIN I   ____________________________________________  EKG  ED ECG REPORT I, Felix Ahmadi  Jaiden Wahab, the attending physician, personally viewed and interpreted this ECG.  Date: 11/29/2017 EKG Time: 1222 Rate: 66 Rhythm: normal sinus rhythm QRS Axis: normal Intervals: normal ST/T Wave abnormalities: normal Narrative Interpretation: no evidence of acute ischemia  ED ECG REPORT I, Arta Silence, the attending physician, personally viewed and interpreted this ECG.  Date: 11/29/2017 EKG Time: 1419 Rate: 62 Rhythm: normal sinus rhythm QRS Axis: normal Intervals: normal ST/T Wave abnormalities: normal Narrative Interpretation: no evidence of acute ischemia; no acute change from EKG of 1222 today  ____________________________________________  RADIOLOGY  CXR: No focal infiltrate, edema, or other acute findings  ____________________________________________   PROCEDURES  Procedure(s) performed: No  Procedures  Critical Care performed: No ____________________________________________   INITIAL IMPRESSION / ASSESSMENT AND PLAN / ED COURSE  Pertinent labs & imaging results that were available during my care of the patient were reviewed by me and considered in my medical decision making (see chart for details).  84 year old female with past medical history as noted above including history of CAD presents  with an episode of atypical, sharp substernal chest pain today which has now resolved.  Per family members, the patient had some lightheadedness yesterday morning and a brief episode of syncope but has otherwise been comfortable and in her usual state of health.  I reviewed the past medical records in epic; the patient was last seen in the ED over 1 year ago for chest pain with negative troponins x2 and discharged home.  On exam today, the patient is relatively well-appearing, she was slightly hypertensive at triage but blood pressure is now within the normal range, she has no active chest pain.  The remainder the exam is unremarkable.  Neuro exam is nonfocal.  The patient does not appear dehydrated.  Chest pain is most likely GERD, musculoskeletal, or other benign cause.  Given her history of CAD and increased risk of ACS we will obtain cardiac enzymes x2 and discussed with her cardiologist but anticipate possible discharge home if workup is negative and she remained stable.  In terms of the syncope, this is most consistent with vasovagal episode.  Patient currently does not feel dizzy or lightheaded, her vital signs are reassuring, and her neuro exam is nonfocal.  We will obtain basic labs, as well as a UA to rule out precipitating causes.   ----------------------------------------- 4:19 PM on 11/29/2017 -----------------------------------------  Repeat EKG and troponin are negative.  Patient continues to be asymptomatic, with stable vitals.  She feels well and would like to go home.  I offered to contact patient's cardiologist Dr. Nehemiah Massed to help arrange for follow-up, but the patient and her grandson who is with her declined and stated that they would call the office themselves.  Patient has follow-up already arranged next week.  Return precautions given, and they expressed understanding.     ____________________________________________   FINAL CLINICAL IMPRESSION(S) / ED  DIAGNOSES  Final diagnoses:  Atypical chest pain      NEW MEDICATIONS STARTED DURING THIS VISIT:  New Prescriptions   No medications on file     Note:  This document was prepared using Dragon voice recognition software and may include unintentional dictation errors.    Arta Silence, MD 11/29/17 [phone]

## 2017-11-29 NOTE — ED Notes (Signed)
Pt has a short run of HR of 211.  EDP aware. Family at bedside. Family also informed EDP. Rn will continue to monitor.

## 2017-11-29 NOTE — ED Triage Notes (Signed)
Chest pain to left chest that is intermittent since last night.  Also c/o mid back pain.  Denies SHOB, nausea or vomiting. Color WNL.  Syncope yesterday AM and this AM as well per pt report.  Lives alone. Pt feels like when had MI before.

## 2018-03-07 ENCOUNTER — Other Ambulatory Visit: Payer: Self-pay | Admitting: Internal Medicine

## 2018-03-07 DIAGNOSIS — D649 Anemia, unspecified: Secondary | ICD-10-CM

## 2018-03-25 ENCOUNTER — Inpatient Hospital Stay (HOSPITAL_BASED_OUTPATIENT_CLINIC_OR_DEPARTMENT_OTHER): Payer: Medicare Other | Admitting: Internal Medicine

## 2018-03-25 ENCOUNTER — Inpatient Hospital Stay: Payer: Medicare Other | Attending: Internal Medicine

## 2018-03-25 ENCOUNTER — Other Ambulatory Visit: Payer: Self-pay

## 2018-03-25 VITALS — BP 171/64 | HR 60 | Temp 97.9°F | Resp 20 | Ht 62.0 in | Wt 108.6 lb

## 2018-03-25 DIAGNOSIS — I129 Hypertensive chronic kidney disease with stage 1 through stage 4 chronic kidney disease, or unspecified chronic kidney disease: Secondary | ICD-10-CM | POA: Insufficient documentation

## 2018-03-25 DIAGNOSIS — N183 Chronic kidney disease, stage 3 (moderate): Secondary | ICD-10-CM | POA: Insufficient documentation

## 2018-03-25 DIAGNOSIS — D539 Nutritional anemia, unspecified: Secondary | ICD-10-CM | POA: Insufficient documentation

## 2018-03-25 DIAGNOSIS — D473 Essential (hemorrhagic) thrombocythemia: Secondary | ICD-10-CM | POA: Insufficient documentation

## 2018-03-25 LAB — COMPREHENSIVE METABOLIC PANEL
ALBUMIN: 3.7 g/dL (ref 3.5–5.0)
ALT: 9 U/L (ref 0–44)
ANION GAP: 8 (ref 5–15)
AST: 20 U/L (ref 15–41)
Alkaline Phosphatase: 66 U/L (ref 38–126)
BILIRUBIN TOTAL: 0.5 mg/dL (ref 0.3–1.2)
BUN: 27 mg/dL — AB (ref 8–23)
CHLORIDE: 109 mmol/L (ref 98–111)
CO2: 25 mmol/L (ref 22–32)
CREATININE: 1.2 mg/dL — AB (ref 0.44–1.00)
Calcium: 9.1 mg/dL (ref 8.9–10.3)
GFR calc Af Amer: 42 mL/min — ABNORMAL LOW (ref >60)
GFR calc non Af Amer: 37 mL/min — ABNORMAL LOW (ref >60)
Glucose, Bld: 140 mg/dL — ABNORMAL HIGH (ref 70–99)
POTASSIUM: 4.3 mmol/L (ref 3.5–5.1)
Sodium: 142 mmol/L (ref 135–145)
Total Protein: 6.6 g/dL (ref 6.5–8.1)

## 2018-03-25 LAB — CBC WITH DIFFERENTIAL/PLATELET
Basophils Absolute: 0.2 10*3/uL — ABNORMAL HIGH (ref 0–0.1)
Basophils Relative: 3 %
EOS ABS: 0.1 10*3/uL (ref 0–0.7)
EOS PCT: 2 %
HCT: 28.2 % — ABNORMAL LOW (ref 35.0–47.0)
Hemoglobin: 9.6 g/dL — ABNORMAL LOW (ref 12.0–16.0)
LYMPHS ABS: 0.8 10*3/uL — AB (ref 1.0–3.6)
LYMPHS PCT: 15 %
MCH: 41.2 pg — AB (ref 26.0–34.0)
MCHC: 33.9 g/dL (ref 32.0–36.0)
MCV: 121.5 fL — AB (ref 80.0–100.0)
MONOS PCT: 6 %
Monocytes Absolute: 0.3 10*3/uL (ref 0.2–0.9)
Neutro Abs: 3.8 10*3/uL (ref 1.4–6.5)
Neutrophils Relative %: 74 %
PLATELETS: 419 10*3/uL (ref 150–440)
RBC: 2.32 MIL/uL — AB (ref 3.80–5.20)
RDW: 13.2 % (ref 11.5–14.5)
WBC: 5.1 10*3/uL (ref 3.6–11.0)

## 2018-03-25 NOTE — Progress Notes (Signed)
[facility]  Telephone:([phone]) [phone]  Fax:([phone]) [phone]     [name] DOB: 07/08/1919  MR#: [id number]  IRJ#:[id number]  Patient Care Team: Dion Body, MD as PCP - General (Family Medicine)  CHIEF COMPLAINT:  Chief Complaint  Patient presents with  . thrombocytosis   Oncology History   # ESSENTIAL THROMBOCYTOSIS [Jak-2-NEG; Bcr-abl-NEG] on Hydrea 500 day.;July 2019- Hydrea 500 q OD [sec to worsening anemia]  # CKD- stage III/creat 1.2-1.4     Essential thrombocytosis (HCC)     INTERVAL HISTORY:   84 year old female patient with above history of essential thrombocytosis is here for follow-up. Patient is on Hydrea 500 mg a day. She is also on aspirin.    No history of any blood clots.  Denies any burning pain in hand and feet.  No weight loss.  No night sweats.  No skin rash.  Denies any tiredness.  REVIEW OF SYSTEMS:   Review of Systems  Constitutional: Negative for chills, diaphoresis, fever, malaise/fatigue and weight loss.  HENT: Negative for congestion, ear discharge, ear pain, hearing loss, nosebleeds, sore throat and tinnitus.   Eyes: Negative for blurred vision, double vision, photophobia, pain, discharge and redness.  Respiratory: Negative for cough, hemoptysis, sputum production, shortness of breath, wheezing and stridor.   Cardiovascular: Negative for chest pain, palpitations, orthopnea, claudication, leg swelling and PND.  Gastrointestinal: Negative for abdominal pain, blood in stool, constipation, diarrhea, heartburn, melena, nausea and vomiting.  Genitourinary: Negative.   Musculoskeletal: Negative.   Skin: Negative.   Neurological: Negative for dizziness, tingling, focal weakness, seizures, weakness and headaches.  Endo/Heme/Allergies: Does not bruise/bleed easily.  Psychiatric/Behavioral: Negative for depression. The patient is not nervous/anxious and does not have insomnia.       PAST MEDICAL HISTORY: Past Medical History:    Diagnosis Date  . Anemia   . CAD (coronary artery disease)   . CKD (chronic kidney disease)   . Diverticulosis   . Essential thrombocytosis ([place]) 05/16/2015  . GERD (gastroesophageal reflux disease)   . GI bleed   . HTN (hypertension)   . NSTEMI (non-ST elevated myocardial infarction) ([place])   . Osteoporosis   . Thrombocytosis ([place])   . TIA (transient ischemic attack)     PAST SURGICAL HISTORY: Past Surgical History:  Procedure Laterality Date  . ABDOMINAL HYSTERECTOMY    . COLONOSCOPY      FAMILY HISTORY Family History  Problem Relation Age of Onset  . Cancer Mother   . Stroke Father     GYNECOLOGIC HISTORY:  No LMP recorded. Patient has had a hysterectomy.     ADVANCED DIRECTIVES:    HEALTH MAINTENANCE: Social History   Tobacco Use  . Smoking status: Never Smoker  . Smokeless tobacco: Never Used  Substance Use Topics  . Alcohol use: No  . Drug use: No     Allergies  Allergen Reactions  . Alendronate Sodium Rash  . Calcitonin Rash  . Cephalexin Rash  . Raloxifene Rash  . Tramadol Rash    Current Outpatient Medications  Medication Sig Dispense Refill  . acetaminophen (TYLENOL) 325 MG tablet Take 650 mg by mouth every 4 (four) hours as needed.     Marland Kitchen amLODipine (NORVASC) 5 MG tablet Take 5 mg by mouth daily.    Marland Kitchen aspirin 81 MG tablet Take 81 mg by mouth daily.    Marland Kitchen atorvastatin (LIPITOR) 10 MG tablet Take 10 mg by mouth daily.    . furosemide (LASIX) 20 MG tablet Take 20 mg  by mouth every other day.    . hydroxyurea (HYDREA) 500 MG capsule Take 1 capsule (500 mg total) by mouth daily. 90 capsule 3  . iron polysaccharides (NIFEREX) 150 MG capsule Take 150 mg by mouth daily.    . isosorbide mononitrate (IMDUR) 60 MG 24 hr tablet Take 1 tablet (60 mg total) by mouth daily. (Patient taking differently: Take 30 mg by mouth daily. ) 30 tablet 0  . metoprolol succinate (TOPROL-XL) 50 MG 24 hr tablet Take 1 tablet by mouth daily.    Marland Kitchen omeprazole (PRILOSEC) 20  MG capsule Take 1 capsule (20 mg total) by mouth 2 (two) times daily before a meal.    . potassium chloride SA (K-DUR,KLOR-CON) 20 MEQ tablet Take 20 mEq by mouth every other day.     . vitamin B-12 (CYANOCOBALAMIN) 500 MCG tablet Take 1,000 mcg by mouth daily.      No current facility-administered medications for this visit.     OBJECTIVE: BP (!) 171/64 (BP Location: Right Arm, Patient Position: Sitting)   Pulse 60   Temp 97.9 F (36.6 C) (Tympanic)   Resp 20   Ht 5' 2"  (1.575 m)   Wt 108 lb 9.6 oz (49.3 kg)   BMI 19.86 kg/m    Body mass index is 19.86 kg/m.    ECOG FS:0 - Asymptomatic   GENERAL: Well-nourished well-developed; Alert, no distress and comfortable.  Accompanied by family.  EYES: no pallor or icterus OROPHARYNX: no thrush or ulceration; NECK: supple; no lymph nodes felt. LYMPH:  no palpable lymphadenopathy in the axillary or inguinal regions LUNGS: Decreased breath sounds auscultation bilaterally. No wheeze or crackles HEART/CVS: regular rate & rhythm and no murmurs; No lower extremity edema ABDOMEN:abdomen soft, non-tender and normal bowel sounds. No hepatomegaly or splenomegaly.  Musculoskeletal:no cyanosis of digits and no clubbing  PSYCH: alert & oriented x 3 with fluent speech NEURO: no focal motor/sensory deficits SKIN:  no rashes or significant lesions   LAB RESULTS:  Appointment on 03/25/2018  Component Date Value Ref Range Status  . Sodium 03/25/2018 142  135 - 145 mmol/L Final  . Potassium 03/25/2018 4.3  3.5 - 5.1 mmol/L Final  . Chloride 03/25/2018 109  98 - 111 mmol/L Final  . CO2 03/25/2018 25  22 - 32 mmol/L Final  . Glucose, Bld 03/25/2018 140* 70 - 99 mg/dL Final  . BUN 03/25/2018 27* 8 - 23 mg/dL Final  . Creatinine, Ser 03/25/2018 1.20* 0.44 - 1.00 mg/dL Final  . Calcium 03/25/2018 9.1  8.9 - 10.3 mg/dL Final  . Total Protein 03/25/2018 6.6  6.5 - 8.1 g/dL Final  . Albumin 03/25/2018 3.7  3.5 - 5.0 g/dL Final  . AST 03/25/2018 20  15 -  41 U/L Final  . ALT 03/25/2018 9  0 - 44 U/L Final  . Alkaline Phosphatase 03/25/2018 66  38 - 126 U/L Final  . Total Bilirubin 03/25/2018 0.5  0.3 - 1.2 mg/dL Final  . GFR calc non Af Amer 03/25/2018 37* >60 mL/min Final  . GFR calc Af Amer 03/25/2018 42* >60 mL/min Final   Comment: (NOTE) The eGFR has been calculated using the CKD EPI equation. This calculation has not been validated in all clinical situations. eGFR's persistently <60 mL/min signify possible Chronic Kidney Disease.   Georgiann Hahn gap 03/25/2018 8  5 - 15 Final   Performed at [facility], [address] [place], [place] [address]  . WBC 03/25/2018 5.1  3.6 - 11.0 K/uL  Final  . RBC 03/25/2018 2.32* 3.80 - 5.20 MIL/uL Final  . Hemoglobin 03/25/2018 9.6* 12.0 - 16.0 g/dL Final  . HCT 03/25/2018 28.2* 35.0 - 47.0 % Final  . MCV 03/25/2018 121.5* 80.0 - 100.0 fL Final  . MCH 03/25/2018 41.2* 26.0 - 34.0 pg Final  . MCHC 03/25/2018 33.9  32.0 - 36.0 g/dL Final  . RDW 03/25/2018 13.2  11.5 - 14.5 % Final  . Platelets 03/25/2018 419  150 - 440 K/uL Final  . Neutrophils Relative % 03/25/2018 74  % Final  . Neutro Abs 03/25/2018 3.8  1.4 - 6.5 K/uL Final  . Lymphocytes Relative 03/25/2018 15  % Final  . Lymphs Abs 03/25/2018 0.8* 1.0 - 3.6 K/uL Final  . Monocytes Relative 03/25/2018 6  % Final  . Monocytes Absolute 03/25/2018 0.3  0.2 - 0.9 K/uL Final  . Eosinophils Relative 03/25/2018 2  % Final  . Eosinophils Absolute 03/25/2018 0.1  0 - 0.7 K/uL Final  . Basophils Relative 03/25/2018 3  % Final  . Basophils Absolute 03/25/2018 0.2* 0 - 0.1 K/uL Final   Performed at [facility], [address] [place], [place] [address]    STUDIES: No results found.  ASSESSMENT:  Essential thrombocytosis  Essential thrombocytosis ([place]) # ESSENTIAL THROMBOCYTOSIS-  Jack 2 negative; patient currently On Hydrea 500 mg once a day/aspirin.  Patient tolerating therapy very well-except for worsening anemia [see discussion  below]   #  Anemia with macrocytosis-  Hemoglobin  9.5  Likely from  Hydrea.  Recommend 500 mg QOD.  Clinically do not suspect transformation or progression to myelofibrosis.  #Chronically blood pressure- not elevated at home; as per family.  # CKD-stage III.  Creatinine 1.2/ continue follow up with PCP.   #  Recommend follow-up in 6 months/ CBC CMP/LDH.   Cammie Sickle, MD   03/25/2018 9:24 PM

## 2018-03-25 NOTE — Assessment & Plan Note (Addendum)
#   ESSENTIAL THROMBOCYTOSIS-  [name] 2 negative; patient currently On Hydrea 500 mg once a day/aspirin.  Patient tolerating therapy very well-except for worsening anemia [see discussion below]   #  Anemia with macrocytosis-  Hemoglobin 9.5  Likely from  Hydrea. Recommend 500 mg QOD.  Clinically do not suspect transformation or progression to myelofibrosis.  #Chronically blood pressure- not elevated at home; as per family.  # CKD-stage III.  Creatinine 1.2/ continue follow up with PCP.   #  Recommend follow-up in 6 months/ CBC CMP/LDH.

## 2018-06-21 ENCOUNTER — Other Ambulatory Visit: Payer: Self-pay

## 2018-06-21 ENCOUNTER — Emergency Department
Admission: EM | Admit: 2018-06-21 | Discharge: 2018-06-21 | Disposition: A | Discharge status: Home / Self Care | Payer: Medicare Other | Attending: Emergency Medicine | Admitting: Emergency Medicine

## 2018-06-21 ENCOUNTER — Encounter: Payer: Self-pay | Admitting: Emergency Medicine

## 2018-06-21 DIAGNOSIS — Z7982 Long term (current) use of aspirin: Secondary | ICD-10-CM | POA: Insufficient documentation

## 2018-06-21 DIAGNOSIS — Z79899 Other long term (current) drug therapy: Secondary | ICD-10-CM | POA: Insufficient documentation

## 2018-06-21 DIAGNOSIS — Z0000 Encounter for general adult medical examination without abnormal findings: Secondary | ICD-10-CM | POA: Insufficient documentation

## 2018-06-21 DIAGNOSIS — K625 Hemorrhage of anus and rectum: Secondary | ICD-10-CM | POA: Insufficient documentation

## 2018-06-21 DIAGNOSIS — I129 Hypertensive chronic kidney disease with stage 1 through stage 4 chronic kidney disease, or unspecified chronic kidney disease: Secondary | ICD-10-CM | POA: Diagnosis not present

## 2018-06-21 DIAGNOSIS — N184 Chronic kidney disease, stage 4 (severe): Secondary | ICD-10-CM | POA: Diagnosis not present

## 2018-06-21 DIAGNOSIS — I2510 Atherosclerotic heart disease of native coronary artery without angina pectoris: Secondary | ICD-10-CM | POA: Diagnosis not present

## 2018-06-21 LAB — URINALYSIS, COMPLETE (UACMP) WITH MICROSCOPIC
Bilirubin Urine: NEGATIVE
Glucose, UA: NEGATIVE mg/dL
Hgb urine dipstick: NEGATIVE
Ketones, ur: NEGATIVE mg/dL
Leukocytes, UA: NEGATIVE
NITRITE: NEGATIVE
PH: 6 (ref 5.0–8.0)
Protein, ur: NEGATIVE mg/dL
SPECIFIC GRAVITY, URINE: 1.005 (ref 1.005–1.030)

## 2018-06-21 LAB — CBC
HCT: 34.1 % — ABNORMAL LOW (ref 36.0–46.0)
Hemoglobin: 10.7 g/dL — ABNORMAL LOW (ref 12.0–15.0)
MCH: 37.3 pg — AB (ref 26.0–34.0)
MCHC: 31.4 g/dL (ref 30.0–36.0)
MCV: 118.8 fL — ABNORMAL HIGH (ref 80.0–100.0)
PLATELETS: 619 10*3/uL — AB (ref 150–400)
RBC: 2.87 MIL/uL — AB (ref 3.87–5.11)
RDW: 12.9 % (ref 11.5–15.5)
WBC: 5 10*3/uL (ref 4.0–10.5)
nRBC: 0 % (ref 0.0–0.2)

## 2018-06-21 LAB — COMPREHENSIVE METABOLIC PANEL
ALBUMIN: 4.1 g/dL (ref 3.5–5.0)
ALK PHOS: 67 U/L (ref 38–126)
ALT: 13 U/L (ref 0–44)
AST: 22 U/L (ref 15–41)
Anion gap: 9 (ref 5–15)
BILIRUBIN TOTAL: 0.8 mg/dL (ref 0.3–1.2)
BUN: 28 mg/dL — ABNORMAL HIGH (ref 8–23)
CALCIUM: 9.8 mg/dL (ref 8.9–10.3)
CO2: 28 mmol/L (ref 22–32)
Chloride: 106 mmol/L (ref 98–111)
Creatinine, Ser: 1.34 mg/dL — ABNORMAL HIGH (ref 0.44–1.00)
GFR calc Af Amer: 37 mL/min — ABNORMAL LOW (ref >60)
GFR calc non Af Amer: 32 mL/min — ABNORMAL LOW (ref >60)
GLUCOSE: 131 mg/dL — AB (ref 70–99)
Potassium: 4.1 mmol/L (ref 3.5–5.1)
SODIUM: 143 mmol/L (ref 135–145)
TOTAL PROTEIN: 7.5 g/dL (ref 6.5–8.1)

## 2018-06-21 LAB — TYPE AND SCREEN
ABO/RH(D): A POS
Antibody Screen: NEGATIVE

## 2018-06-21 NOTE — Discharge Instructions (Addendum)
You were evaluated for blood on pad 2 days ago, and your exam and evaluation are reassuring in the emergency department today.  Return to the emergency room immediately for any black or bloody stool, bloody urine, abdominal pain, dizziness or passing out, weakness, or any other symptoms concerning to you.

## 2018-06-21 NOTE — ED Notes (Signed)
First Nurse Note: Patient to ED via [facility] from [place].  Complaining of rectal bleeding.  Very HOH.  Alert and oriented.  [name] states that the bleeding started on Sat.

## 2018-06-21 NOTE — ED Provider Notes (Signed)
[facility] Emergency Department Provider Note ____________________________________________   I have reviewed the triage vital signs and the triage nursing note.  HISTORY  Chief Complaint Rectal Bleeding   Historian Patient and grandson (who is EMT)  HPI [name] is a 84 y.o. female with history of prior GI bleed (remote) requiring blood transfusion, presents with dripping bright red blood yesterday and blood on pad in underwear.  She told grandson today and was brought in for evaluation.  Denies additional bleeding.  Denies abdominal pain.  Denies frequency of urination.  When asked more carefully about whether not this was rectal bleeding versus vaginal bleeding versus hematuria, she is not quite sure.  Denies weakness or dizziness.     Past Medical History:  Diagnosis Date  . Anemia   . CAD (coronary artery disease)   . CKD (chronic kidney disease)   . Diverticulosis   . Essential thrombocytosis ([place]) 05/16/2015  . GERD (gastroesophageal reflux disease)   . GI bleed   . HTN (hypertension)   . NSTEMI (non-ST elevated myocardial infarction) ([place])   . Osteoporosis   . Thrombocytosis ([place])   . TIA (transient ischemic attack)     Patient Active Problem List   Diagnosis Date Noted  . Essential thrombocytosis ([place]) 05/16/2015  . Unstable angina ([place]) 04/22/2015  . HTN (hypertension) 04/22/2015  . GERD (gastroesophageal reflux disease) 04/22/2015  . CKD (chronic kidney disease), stage IV ([place]) 04/22/2015  . Thrombocytosis ([place]) 04/22/2015  . History of GI bleed 04/22/2015  . CAD (coronary artery disease) 04/22/2015  . Macrocytic anemia 03/27/2015    Past Surgical History:  Procedure Laterality Date  . ABDOMINAL HYSTERECTOMY    . COLONOSCOPY      Prior to Admission medications   Medication Sig Start Date End Date Taking? Authorizing Provider  acetaminophen (TYLENOL) 325 MG tablet Take 650 mg by mouth every 4 (four) hours as needed for  mild pain, moderate pain or fever.    Yes [provider]  amLODipine (NORVASC) 5 MG tablet Take 5 mg by mouth daily.   Yes [provider]  aspirin EC 81 MG tablet Take 81 mg by mouth daily.   Yes [provider]  atorvastatin (LIPITOR) 10 MG tablet Take 10 mg by mouth daily.   Yes [provider]  furosemide (LASIX) 20 MG tablet Take 20 mg by mouth every other day.   Yes [provider]  hydroxyurea (HYDREA) 500 MG capsule Take 1 capsule (500 mg total) by mouth daily. 03/17/17  Yes Cammie Sickle, MD  iron polysaccharides (NIFEREX) 150 MG capsule Take 150 mg by mouth daily. Alternates days with furosemide   Yes [provider]  losartan (COZAAR) 50 MG tablet Take 50 mg by mouth at bedtime.   Yes [provider]  metoprolol succinate (TOPROL-XL) 50 MG 24 hr tablet Take 50 mg by mouth daily.    Yes [provider]  omeprazole (PRILOSEC) 20 MG capsule Take 1 capsule (20 mg total) by mouth 2 (two) times daily before a meal. 04/22/15  Yes Wieting, Richard, MD  potassium chloride SA (K-DUR,KLOR-CON) 20 MEQ tablet Take 20 mEq by mouth every other day. Alternates with furosemide   Yes [provider]  vitamin B-12 (CYANOCOBALAMIN) 500 MCG tablet Take 500 mcg by mouth daily.    Yes [provider]    Allergies  Allergen Reactions  . Alendronate Sodium Rash  . Calcitonin Rash  . Cephalexin Rash  . Raloxifene Rash  .  Tramadol Rash    Family History  Problem Relation Age of Onset  . Cancer Mother   . Stroke Father     Social History Social History   Tobacco Use  . Smoking status: Never Smoker  . Smokeless tobacco: Never Used  Substance Use Topics  . Alcohol use: No  . Drug use: No    Review of Systems  Constitutional: Negative for fever. Eyes: Negative for visual changes. ENT: Negative for sore throat. Cardiovascular: Negative for chest pain. Respiratory: Negative for shortness of  breath. Gastrointestinal: Negative for abdominal pain, vomiting and diarrhea. Genitourinary: Negative for dysuria. Musculoskeletal: Negative for back pain. Skin: Negative for rash. Neurological: Negative for headache.  ____________________________________________   PHYSICAL EXAM:  VITAL SIGNS: ED Triage Vitals [06/21/18 1025]  Enc Vitals Group     BP (!) 165/69     Pulse Rate 77     Resp 18     Temp 98.4 F (36.9 C)     Temp Source Oral     SpO2 99 %     Weight 100 lb (45.4 kg)     Height 5\' 1"  (1.549 m)     Head Circumference      Peak Flow      Pain Score 0     Pain Loc      Pain Edu?      Excl. in [place]?      Constitutional: Alert and oriented.  HEENT      Head: Normocephalic and atraumatic.      Eyes: Conjunctivae are normal. Pupils equal and round.       Ears:         Nose: No congestion/rhinnorhea.      Mouth/Throat: Mucous membranes are moist.      Neck: No stridor. Cardiovascular/Chest: Normal rate, regular rhythm.  No murmurs, rubs, or gallops. Respiratory: Normal respiratory effort without tachypnea nor retractions. Breath sounds are clear and equal bilaterally. No wheezes/rales/rhonchi. Gastrointestinal: Soft. No distention, no guarding, no rebound. Nontender.    Genitourinary/rectal: No external hemorrhoids.  Dark stool that is Hemoccult negative. Speculum vaginal exam, prolapse uterus, no bleeding Musculoskeletal: Nontender with normal range of motion in all extremities. No joint effusions.  No lower extremity tenderness.  No edema. Neurologic:  Normal speech and language. No gross or focal neurologic deficits are appreciated. Skin:  Skin is warm, dry and intact. No rash noted. Psychiatric: Mood and affect are normal. Speech and behavior are normal. Patient exhibits appropriate insight and judgment.   ____________________________________________  LABS (pertinent positives/negatives) I, Lisa Roca, MD the attending physician have reviewed the labs  noted below.  Labs Reviewed  COMPREHENSIVE METABOLIC PANEL - Abnormal; Notable for the following components:      Result Value   Glucose, Bld 131 (*)    BUN 28 (*)    Creatinine, Ser 1.34 (*)    GFR calc non Af Amer 32 (*)    GFR calc Af Amer 37 (*)    All other components within normal limits  CBC - Abnormal; Notable for the following components:   RBC 2.87 (*)    Hemoglobin 10.7 (*)    HCT 34.1 (*)    MCV 118.8 (*)    MCH 37.3 (*)    Platelets 619 (*)    All other components within normal limits  URINALYSIS, COMPLETE (UACMP) WITH MICROSCOPIC - Abnormal; Notable for the following components:   Color, Urine COLORLESS (*)    APPearance CLEAR (*)    Bacteria,  UA RARE (*)    All other components within normal limits  POC OCCULT BLOOD, ED  TYPE AND SCREEN    ____________________________________________    EKG I, Lisa Roca, MD, the attending physician have personally viewed and interpreted all ECGs.  None ____________________________________________  RADIOLOGY   None __________________________________________  PROCEDURES  Procedure(s) performed: None  Procedures  Critical Care performed: None   ____________________________________________  ED COURSE / ASSESSMENT AND PLAN  Pertinent labs & imaging results that were available during my care of the patient were reviewed by me and considered in my medical decision making (see chart for details).    Well appearing with no hypotension or tachycardia or weakness to suggest significant volume loss after reported bright red blood per rectum 2 days ago - none since, but history of hemodynamically significant diverticular? Bleed in the past.  No abd pain.  Dark stool hemeoccult neg.  Urine without blood, as alternate source of seeing blood on pad.  Speculum exam neg for vaginal blood.    Chronic kidney disease with bun/cr similar to prior.  Hemoglobin 10, previously 9 several months ago.  At this point unclear  where the blood was coming from, but based on history may have been rectal, but 2 days out now with no additional bleeding, and no drop in hemoglobin and hemodynamically stable, okay for discharge home.   CONSULTATIONS:  None   Patient / Family / Caregiver informed of clinical course, medical decision-making process, and agree with plan.   I discussed return precautions, follow-up instructions, and discharge instructions with patient and/or family.  Discharge Instructions : You were evaluated for blood on pad 2 days ago, and your exam and evaluation are reassuring in the emergency department today.  Return to the emergency room immediately for any black or bloody stool, bloody urine, abdominal pain, dizziness or passing out, weakness, or any other symptoms concerning to you.    ___________________________________________   FINAL CLINICAL IMPRESSION(S) / ED DIAGNOSES   Final diagnoses:  Normal exam  Rectal bleeding      ___________________________________________         Note: This dictation was prepared with Dragon dictation. Any transcriptional errors that result from this process are unintentional    Lisa Roca, MD 06/21/18 1233

## 2018-06-21 NOTE — ED Notes (Signed)
Patient to Rm 26 via WC.  Georgie RN and Dr. Reita Cliche aware of room placement.

## 2018-06-21 NOTE — ED Triage Notes (Signed)
Pt here with c/o bright red rectal bleeding all weekend, denies pain, appears pale, has had rectal bleeding in the past.

## 2018-07-04 ENCOUNTER — Other Ambulatory Visit: Payer: Self-pay | Admitting: Internal Medicine

## 2018-07-04 DIAGNOSIS — D649 Anemia, unspecified: Secondary | ICD-10-CM

## 2018-07-04 NOTE — Telephone Encounter (Signed)
)     Ref Range & Units 13d ago  WBC 4.0 - 10.5 K/uL 5.0   RBC 3.87 - 5.11 MIL/uL 2.87Low    Hemoglobin 12.0 - 15.0 g/dL 10.7Low    HCT 36.0 - 46.0 % 34.1Low    MCV 80.0 - 100.0 fL 118.8High    MCH 26.0 - 34.0 pg 37.3High    MCHC 30.0 - 36.0 g/dL 31.4   RDW 11.5 - 15.5 % 12.9   Platelets 150 - 400 K/uL 619High    nRBC 0.0 - 0.2 % 0.0   Comment: Performed at [facility], [address] [place], [place] [address]  Resulting Agency  [facility]      Specimen Collected: 06/21/18 10:43 Last Resulted: 06/21/18 11:14

## 2018-07-06 ENCOUNTER — Telehealth: Payer: Self-pay | Admitting: Internal Medicine

## 2018-07-06 DIAGNOSIS — D75839 Thrombocytosis, unspecified: Secondary | ICD-10-CM

## 2018-07-06 DIAGNOSIS — D473 Essential (hemorrhagic) thrombocythemia: Secondary | ICD-10-CM

## 2018-07-06 NOTE — Telephone Encounter (Signed)
Collete/brooke- Please have pt get a cbc/cmp/ldh in 2 weeks; will give further recommendations re: hydrea based on labs- Dr.B

## 2018-07-06 NOTE — Addendum Note (Signed)
Addended by: Sandria Bales B on: 07/06/2018 01:42 PM   Modules accepted: Orders

## 2018-07-20 ENCOUNTER — Inpatient Hospital Stay: Payer: Medicare Other | Attending: Internal Medicine

## 2018-07-20 DIAGNOSIS — D473 Essential (hemorrhagic) thrombocythemia: Secondary | ICD-10-CM | POA: Diagnosis present

## 2018-07-20 DIAGNOSIS — N183 Chronic kidney disease, stage 3 (moderate): Secondary | ICD-10-CM | POA: Diagnosis not present

## 2018-07-20 DIAGNOSIS — I129 Hypertensive chronic kidney disease with stage 1 through stage 4 chronic kidney disease, or unspecified chronic kidney disease: Secondary | ICD-10-CM | POA: Diagnosis not present

## 2018-07-20 DIAGNOSIS — D539 Nutritional anemia, unspecified: Secondary | ICD-10-CM | POA: Diagnosis not present

## 2018-07-20 DIAGNOSIS — D75839 Thrombocytosis, unspecified: Secondary | ICD-10-CM

## 2018-07-20 LAB — COMPREHENSIVE METABOLIC PANEL
ALBUMIN: 3.9 g/dL (ref 3.5–5.0)
ALT: 16 U/L (ref 0–44)
ANION GAP: 5 (ref 5–15)
AST: 25 U/L (ref 15–41)
Alkaline Phosphatase: 67 U/L (ref 38–126)
BUN: 27 mg/dL — ABNORMAL HIGH (ref 8–23)
CO2: 28 mmol/L (ref 22–32)
Calcium: 9.3 mg/dL (ref 8.9–10.3)
Chloride: 109 mmol/L (ref 98–111)
Creatinine, Ser: 1.23 mg/dL — ABNORMAL HIGH (ref 0.44–1.00)
GFR calc non Af Amer: 36 mL/min — ABNORMAL LOW (ref >60)
GFR, EST AFRICAN AMERICAN: 41 mL/min — AB (ref >60)
Glucose, Bld: 114 mg/dL — ABNORMAL HIGH (ref 70–99)
POTASSIUM: 4 mmol/L (ref 3.5–5.1)
SODIUM: 142 mmol/L (ref 135–145)
Total Bilirubin: 0.6 mg/dL (ref 0.3–1.2)
Total Protein: 6.9 g/dL (ref 6.5–8.1)

## 2018-07-20 LAB — CBC WITH DIFFERENTIAL/PLATELET
Abs Immature Granulocytes: 0 10*3/uL (ref 0.00–0.07)
BASOS ABS: 0 10*3/uL (ref 0.0–0.1)
Basophils Relative: 1 %
EOS ABS: 0.1 10*3/uL (ref 0.0–0.5)
Eosinophils Relative: 2 %
HEMATOCRIT: 30.1 % — AB (ref 36.0–46.0)
HEMOGLOBIN: 9.4 g/dL — AB (ref 12.0–15.0)
Immature Granulocytes: 0 %
LYMPHS ABS: 1.1 10*3/uL (ref 0.7–4.0)
Lymphocytes Relative: 21 %
MCH: 36.2 pg — ABNORMAL HIGH (ref 26.0–34.0)
MCHC: 31.2 g/dL (ref 30.0–36.0)
MCV: 115.8 fL — ABNORMAL HIGH (ref 80.0–100.0)
Monocytes Absolute: 0.4 10*3/uL (ref 0.1–1.0)
Monocytes Relative: 8 %
NRBC: 0 % (ref 0.0–0.2)
Neutro Abs: 3.5 10*3/uL (ref 1.7–7.7)
Neutrophils Relative %: 68 %
Platelets: 589 10*3/uL — ABNORMAL HIGH (ref 150–400)
RBC: 2.6 MIL/uL — ABNORMAL LOW (ref 3.87–5.11)
RDW: 12.3 % (ref 11.5–15.5)
WBC: 5.1 10*3/uL (ref 4.0–10.5)

## 2018-07-20 LAB — LACTATE DEHYDROGENASE: LDH: 170 U/L (ref 98–192)

## 2018-09-22 ENCOUNTER — Other Ambulatory Visit: Payer: Self-pay

## 2018-09-22 ENCOUNTER — Inpatient Hospital Stay (HOSPITAL_BASED_OUTPATIENT_CLINIC_OR_DEPARTMENT_OTHER): Payer: Medicare Other | Admitting: Internal Medicine

## 2018-09-22 ENCOUNTER — Encounter: Payer: Self-pay | Admitting: Internal Medicine

## 2018-09-22 ENCOUNTER — Inpatient Hospital Stay: Payer: Medicare Other | Attending: Internal Medicine

## 2018-09-22 VITALS — BP 174/69 | HR 63 | Temp 97.9°F | Resp 20 | Ht 61.0 in | Wt 108.0 lb

## 2018-09-22 DIAGNOSIS — D649 Anemia, unspecified: Secondary | ICD-10-CM

## 2018-09-22 DIAGNOSIS — D473 Essential (hemorrhagic) thrombocythemia: Secondary | ICD-10-CM

## 2018-09-22 DIAGNOSIS — Z7982 Long term (current) use of aspirin: Secondary | ICD-10-CM | POA: Diagnosis not present

## 2018-09-22 DIAGNOSIS — N183 Chronic kidney disease, stage 3 (moderate): Secondary | ICD-10-CM | POA: Diagnosis not present

## 2018-09-22 DIAGNOSIS — Z79899 Other long term (current) drug therapy: Secondary | ICD-10-CM | POA: Diagnosis not present

## 2018-09-22 LAB — CBC WITH DIFFERENTIAL/PLATELET
Abs Immature Granulocytes: 0.02 10*3/uL (ref 0.00–0.07)
BASOS ABS: 0.1 10*3/uL (ref 0.0–0.1)
Basophils Relative: 1 %
EOS ABS: 0.1 10*3/uL (ref 0.0–0.5)
EOS PCT: 2 %
HCT: 30.9 % — ABNORMAL LOW (ref 36.0–46.0)
Hemoglobin: 9.3 g/dL — ABNORMAL LOW (ref 12.0–15.0)
Immature Granulocytes: 0 %
LYMPHS ABS: 0.9 10*3/uL (ref 0.7–4.0)
Lymphocytes Relative: 12 %
MCH: 32.2 pg (ref 26.0–34.0)
MCHC: 30.1 g/dL (ref 30.0–36.0)
MCV: 106.9 fL — ABNORMAL HIGH (ref 80.0–100.0)
Monocytes Absolute: 0.5 10*3/uL (ref 0.1–1.0)
Monocytes Relative: 7 %
NRBC: 0 % (ref 0.0–0.2)
Neutro Abs: 6.1 10*3/uL (ref 1.7–7.7)
Neutrophils Relative %: 78 %
Platelets: 1001 10*3/uL (ref 150–400)
RBC: 2.89 MIL/uL — ABNORMAL LOW (ref 3.87–5.11)
RDW: 12.5 % (ref 11.5–15.5)
Smear Review: INCREASED
WBC: 7.7 10*3/uL (ref 4.0–10.5)

## 2018-09-22 LAB — COMPREHENSIVE METABOLIC PANEL WITH GFR
ALT: 13 U/L (ref 0–44)
AST: 19 U/L (ref 15–41)
Albumin: 3.7 g/dL (ref 3.5–5.0)
Alkaline Phosphatase: 77 U/L (ref 38–126)
Anion gap: 6 (ref 5–15)
BUN: 22 mg/dL (ref 8–23)
CO2: 27 mmol/L (ref 22–32)
Calcium: 9.1 mg/dL (ref 8.9–10.3)
Chloride: 111 mmol/L (ref 98–111)
Creatinine, Ser: 1.12 mg/dL — ABNORMAL HIGH (ref 0.44–1.00)
GFR calc Af Amer: 48 mL/min — ABNORMAL LOW
GFR calc non Af Amer: 41 mL/min — ABNORMAL LOW
Glucose, Bld: 121 mg/dL — ABNORMAL HIGH (ref 70–99)
Potassium: 4.4 mmol/L (ref 3.5–5.1)
Sodium: 144 mmol/L (ref 135–145)
Total Bilirubin: 0.5 mg/dL (ref 0.3–1.2)
Total Protein: 6.7 g/dL (ref 6.5–8.1)

## 2018-09-22 LAB — IRON AND TIBC
Iron: 52 ug/dL (ref 28–170)
Saturation Ratios: 16 % (ref 10.4–31.8)
TIBC: 325 ug/dL (ref 250–450)
UIBC: 273 ug/dL

## 2018-09-22 LAB — FERRITIN: FERRITIN: 16 ng/mL (ref 11–307)

## 2018-09-22 MED ORDER — HYDROXYUREA 500 MG PO CAPS: ORAL_CAPSULE | ORAL | 3 refills | Status: DC

## 2018-09-22 NOTE — Progress Notes (Signed)
[place]  [phone]  [phone]     [name] DOB: Apr 11, 1919  MR#: [id number]  VQQ#:[id number]  Patient Care Team: Dion Body, MD as PCP - General (Family Medicine)  CHIEF COMPLAINT:  Chief Complaint  Patient presents with  . thrombocytosis   Oncology History   # ESSENTIAL THROMBOCYTOSIS [Jak-2-NEG; Bcr-abl-NEG] on Hydrea 500 day.;July 2019- Hydrea 500 q OD [sec to worsening anemia]  # CKD- stage III/creat 1.2-1.4     Essential thrombocytosis (H[facility])     INTERVAL HISTORY:   85 year old female patient with above history of essential thrombocytosis is here for follow-up.  Patient was taken off Hydrea approximately 2 months ago when she presented to the emergency room for anemia/vaginal/rectal bleeding.  She continues to have Hydrea.  Denies any further bleeding.  No headaches.  No recent strokes.  She continues to live independently by herself.  No weight loss.   REVIEW OF SYSTEMS:   Review of Systems  Constitutional: Negative for chills, diaphoresis, fever, malaise/fatigue and weight loss.  HENT: Negative for congestion, ear discharge, ear pain, hearing loss, nosebleeds, sore throat and tinnitus.   Eyes: Negative for blurred vision, double vision, photophobia, pain, discharge and redness.  Respiratory: Negative for cough, hemoptysis, sputum production, shortness of breath, wheezing and stridor.   Cardiovascular: Negative for chest pain, palpitations, orthopnea, claudication, leg swelling and PND.  Gastrointestinal: Negative for abdominal pain, blood in stool, constipation, diarrhea, heartburn, melena, nausea and vomiting.  Genitourinary: Negative.   Musculoskeletal: Negative.   Skin: Negative.   Neurological: Negative for dizziness, tingling, focal weakness, seizures, weakness and headaches.  Endo/Heme/Allergies: Does not bruise/bleed easily.  Psychiatric/Behavioral: Negative for depression. The patient is not nervous/anxious  and does not have insomnia.       PAST MEDICAL HISTORY: Past Medical History:  Diagnosis Date  . Anemia   . CAD (coronary artery disease)   . CKD (chronic kidney disease)   . Diverticulosis   . Essential thrombocytosis ([place]) 05/16/2015  . GERD (gastroesophageal reflux disease)   . GI bleed   . HTN (hypertension)   . NSTEMI (non-ST elevated myocardial infarction) ([place])   . Osteoporosis   . Thrombocytosis ([place])   . TIA (transient ischemic attack)     PAST SURGICAL HISTORY: Past Surgical History:  Procedure Laterality Date  . ABDOMINAL HYSTERECTOMY    . COLONOSCOPY      FAMILY HISTORY Family History  Problem Relation Age of Onset  . Cancer Mother   . Stroke Father     GYNECOLOGIC HISTORY:  No LMP recorded. Patient has had a hysterectomy.     ADVANCED DIRECTIVES:    HEALTH MAINTENANCE: Social History   Tobacco Use  . Smoking status: Never Smoker  . Smokeless tobacco: Never Used  Substance Use Topics  . Alcohol use: No  . Drug use: No     Allergies  Allergen Reactions  . Alendronate Sodium Rash  . Calcitonin Rash  . Cephalexin Rash  . Raloxifene Rash  . Tramadol Rash    Current Outpatient Medications  Medication Sig Dispense Refill  . acetaminophen (TYLENOL) 325 MG tablet Take 650 mg by mouth every 4 (four) hours as needed for mild pain, moderate pain or fever.     Marland Kitchen amLODipine (NORVASC) 5 MG tablet Take 5 mg by mouth daily.    Marland Kitchen aspirin EC 81 MG tablet Take 81 mg by mouth daily.    Marland Kitchen atorvastatin (LIPITOR) 10 MG tablet Take 10 mg by mouth daily.    Marland Kitchen  furosemide (LASIX) 20 MG tablet Take 20 mg by mouth every other day.    . iron polysaccharides (NIFEREX) 150 MG capsule Take 150 mg by mouth daily. Alternates days with furosemide    . losartan (COZAAR) 50 MG tablet Take 50 mg by mouth at bedtime.    . metoprolol succinate (TOPROL-XL) 50 MG 24 hr tablet Take 50 mg by mouth daily.     Marland Kitchen omeprazole (PRILOSEC) 20 MG capsule Take 1 capsule (20 mg total) by  mouth 2 (two) times daily before a meal.    . potassium chloride SA (K-DUR,KLOR-CON) 20 MEQ tablet Take 20 mEq by mouth every other day. Alternates with furosemide    . vitamin B-12 (CYANOCOBALAMIN) 500 MCG tablet Take 1,000 mcg by mouth daily.     . hydroxyurea (HYDREA) 500 MG capsule 1 pill every other day 90 capsule 3   No current facility-administered medications for this visit.     OBJECTIVE: BP (!) 174/69 (BP Location: Left Arm, Patient Position: Sitting)   Pulse 63   Temp 97.9 F (36.6 C) (Tympanic)   Resp 20   Ht _0  (1.549 m)   Wt 108 lb (49 kg)   BMI 20.41 kg/m    Body mass index is 20.41 kg/m.    ECOG FS:0 - Asymptomatic   Physical Exam  Constitutional: She is oriented to person, place, and time and well-developed, well-nourished, and in no distress.  Accompanied by her grandson.  Walking by herself.   HENT:  Head: Normocephalic and atraumatic.  Mouth/Throat: Oropharynx is clear and moist. No oropharyngeal exudate.  Eyes: Pupils are equal, round, and reactive to light.  Neck: Normal range of motion. Neck supple.  Cardiovascular: Normal rate and regular rhythm.  Pulmonary/Chest: No respiratory distress. She has no wheezes.  Abdominal: Soft. Bowel sounds are normal. She exhibits no distension and no mass. There is no abdominal tenderness. There is no rebound and no guarding.  Musculoskeletal: Normal range of motion.        General: No tenderness or edema.  Neurological: She is alert and oriented to person, place, and time.  Skin: Skin is warm.  Psychiatric: Affect normal.     LAB RESULTS:  Orders Only on 09/22/2018  Component Date Value Ref Range Status  . Ferritin 09/22/2018 16  11 - 307 ng/mL Final   Performed at [facility], [address] [place], [place] [address]  . Iron 09/22/2018 52  28 - 170 ug/dL Final  . TIBC 09/22/2018 325  250 - 450 ug/dL Final  . Saturation Ratios 09/22/2018 16  10.4 - 31.8 % Final  . UIBC 09/22/2018 273  ug/dL  Final   Performed at [facility], [address] [place],  [address]  Appointment on 09/22/2018  Component Date Value Ref Range Status  . Sodium 09/22/2018 144  135 - 145 mmol/L Final  . Potassium 09/22/2018 4.4  3.5 - 5.1 mmol/L Final  . Chloride 09/22/2018 111  98 - 111 mmol/L Final  . CO2 09/22/2018 27  22 - 32 mmol/L Final  . Glucose, Bld 09/22/2018 121* 70 - 99 mg/dL Final  . BUN 09/22/2018 22  8 - 23 mg/dL Final  . Creatinine, Ser 09/22/2018 1.12* 0.44 - 1.00 mg/dL Final  . Calcium 09/22/2018 9.1  8.9 - 10.3 mg/dL Final  . Total Protein 09/22/2018 6.7  6.5 - 8.1 g/dL Final  . Albumin 09/22/2018 3.7  3.5 - 5.0 g/dL Final  . AST 09/22/2018 19  15 -  41 U/L Final  . ALT 09/22/2018 13  0 - 44 U/L Final  . Alkaline Phosphatase 09/22/2018 77  38 - 126 U/L Final  . Total Bilirubin 09/22/2018 0.5  0.3 - 1.2 mg/dL Final  . GFR calc non Af Amer 09/22/2018 41* >60 mL/min Final  . GFR calc Af Amer 09/22/2018 48* >60 mL/min Final  . Anion gap 09/22/2018 6  5 - 15 Final   Performed at [facility], [address] [place], [place] [address]  . WBC 09/22/2018 7.7  4.0 - 10.5 K/uL Final  . RBC 09/22/2018 2.89* 3.87 - 5.11 MIL/uL Final  . Hemoglobin 09/22/2018 9.3* 12.0 - 15.0 g/dL Final  . HCT 09/22/2018 30.9* 36.0 - 46.0 % Final  . MCV 09/22/2018 106.9* 80.0 - 100.0 fL Final  . MCH 09/22/2018 32.2  26.0 - 34.0 pg Final  . MCHC 09/22/2018 30.1  30.0 - 36.0 g/dL Final  . RDW 09/22/2018 12.5  11.5 - 15.5 % Final  . Platelets 09/22/2018 1,001* 150 - 400 K/uL Final   This critical result has verified and been called to [place] by [facility] on 01 23 2020 at 1031, and has been read back.   Marland Kitchen nRBC 09/22/2018 0.0  0.0 - 0.2 % Final  . Neutrophils Relative % 09/22/2018 78  % Final  . Neutro Abs 09/22/2018 6.1  1.7 - 7.7 K/uL Final  . Lymphocytes Relative 09/22/2018 12  % Final  . Lymphs Abs 09/22/2018 0.9  0.7 - 4.0 K/uL Final  . Monocytes Relative 09/22/2018 7  %  Final  . Monocytes Absolute 09/22/2018 0.5  0.1 - 1.0 K/uL Final  . Eosinophils Relative 09/22/2018 2  % Final  . Eosinophils Absolute 09/22/2018 0.1  0.0 - 0.5 K/uL Final  . Basophils Relative 09/22/2018 1  % Final  . Basophils Absolute 09/22/2018 0.1  0.0 - 0.1 K/uL Final  . Smear Review 09/22/2018 PLATELETS APPEAR INCREASED   Final   PLATELET COUNT CONFIRMED BY SMEAR  . Immature Granulocytes 09/22/2018 0  % Final  . Abs Immature Granulocytes 09/22/2018 0.02  0.00 - 0.07 K/uL Final   Performed at [facility], [address] [place], [place] [address]    STUDIES: No results found.  ASSESSMENT:  Essential thrombocytosis  Essential thrombocytosis ([place]) # ESSENTIAL THROMBOCYTOSIS-  Jack 2 negative.  Patient currently off Hydrea last 2 months-given her worsening anemia.  #Platelets today 1 million-recommend restarting Hydrea 500 mg every other day.  Discussed regarding use of anagrelide-we have the concern about potential side effects.   #Hemoglobin today is 9-the etiology is unclear; suspect underlying bone marrow disorder/MDS.  Do not suspect Hydrea as she has been off for the last 2 months.  Discussed that this would need a bone marrow biopsy.  Patient family reluctant given advanced age.  Continue B12/iron for now.    #Chronically blood pressure-stable not elevated at home; as per family.  # CKD-stage III.  Stable  # DISPOSITION: add iron studies/ferritin/ldh # follow up in 3 months- cbc/bmp/ldh/b12-folate-Dr.B.   Cammie Sickle, MD   09/22/2018 7:25 PM

## 2018-09-22 NOTE — Assessment & Plan Note (Addendum)
#  ESSENTIAL THROMBOCYTOSIS-  [name] 2 negative.  Patient currently off Hydrea last 2 months-given her worsening anemia.  #Platelets today 1 million-recommend restarting Hydrea 500 mg every other day.  Discussed regarding use of anagrelide-we have the concern about potential side effects.   #Hemoglobin today is 9-the etiology is unclear; suspect underlying bone marrow disorder/MDS.  Do not suspect Hydrea as she has been off for the last 2 months.  Discussed that this would need a bone marrow biopsy.  Patient family reluctant given advanced age.  Continue B12/iron for now.    #Chronically blood pressure-stable not elevated at home; as per family.  # CKD-stage III.  Stable  # DISPOSITION: add iron studies/ferritin/ldh # follow up in 3 months- cbc/bmp/ldh/b12-folate-Dr.B.

## 2018-09-22 NOTE — Progress Notes (Signed)
09/22/2018-1031 am - received notification from [facility] in cancer ctr lab regarding critical plt count of 1001. Read back process performed with lab tech. Dr. Rogue Bussing informed 1055 am. Read back process performedx with md. Patient has not been taking hydrea due to renal function- dose has been held.

## 2018-12-16 ENCOUNTER — Telehealth: Payer: Self-pay | Admitting: *Deleted

## 2018-12-16 NOTE — Telephone Encounter (Signed)
I spoke with patient's grandson to inform him about the virtual visit. He would like the patient's apt next week to be changed to lab only and be contacted with the results. He does not want to do a virtual visit at this time.  Colette, please change to lab only.

## 2018-12-19 ENCOUNTER — Other Ambulatory Visit: Payer: Self-pay

## 2018-12-20 ENCOUNTER — Other Ambulatory Visit: Payer: Self-pay

## 2018-12-20 ENCOUNTER — Telehealth: Payer: Self-pay | Admitting: Internal Medicine

## 2018-12-20 ENCOUNTER — Other Ambulatory Visit: Payer: Self-pay | Admitting: *Deleted

## 2018-12-20 ENCOUNTER — Inpatient Hospital Stay: Payer: Medicare Other | Attending: Internal Medicine

## 2018-12-20 DIAGNOSIS — Z7982 Long term (current) use of aspirin: Secondary | ICD-10-CM | POA: Diagnosis not present

## 2018-12-20 DIAGNOSIS — D473 Essential (hemorrhagic) thrombocythemia: Secondary | ICD-10-CM | POA: Diagnosis not present

## 2018-12-20 DIAGNOSIS — Z79899 Other long term (current) drug therapy: Secondary | ICD-10-CM | POA: Insufficient documentation

## 2018-12-20 DIAGNOSIS — D649 Anemia, unspecified: Secondary | ICD-10-CM | POA: Diagnosis not present

## 2018-12-20 DIAGNOSIS — N183 Chronic kidney disease, stage 3 (moderate): Secondary | ICD-10-CM | POA: Insufficient documentation

## 2018-12-20 DIAGNOSIS — D75839 Thrombocytosis, unspecified: Secondary | ICD-10-CM

## 2018-12-20 LAB — CBC WITH DIFFERENTIAL/PLATELET
Abs Immature Granulocytes: 0.02 10*3/uL (ref 0.00–0.07)
Basophils Absolute: 0 10*3/uL (ref 0.0–0.1)
Basophils Relative: 1 %
Eosinophils Absolute: 0.1 10*3/uL (ref 0.0–0.5)
Eosinophils Relative: 2 %
HCT: 35 % — ABNORMAL LOW (ref 36.0–46.0)
Hemoglobin: 10.9 g/dL — ABNORMAL LOW (ref 12.0–15.0)
Immature Granulocytes: 0 %
Lymphocytes Relative: 17 %
Lymphs Abs: 1 10*3/uL (ref 0.7–4.0)
MCH: 31.7 pg (ref 26.0–34.0)
MCHC: 31.1 g/dL (ref 30.0–36.0)
MCV: 101.7 fL — ABNORMAL HIGH (ref 80.0–100.0)
Monocytes Absolute: 0.4 10*3/uL (ref 0.1–1.0)
Monocytes Relative: 7 %
Neutro Abs: 4.3 10*3/uL (ref 1.7–7.7)
Neutrophils Relative %: 73 %
Platelets: 874 10*3/uL — ABNORMAL HIGH (ref 150–400)
RBC: 3.44 MIL/uL — ABNORMAL LOW (ref 3.87–5.11)
RDW: 17.2 % — ABNORMAL HIGH (ref 11.5–15.5)
WBC: 5.9 10*3/uL (ref 4.0–10.5)
nRBC: 0 % (ref 0.0–0.2)

## 2018-12-20 LAB — BASIC METABOLIC PANEL
Anion gap: 7 (ref 5–15)
BUN: 24 mg/dL — ABNORMAL HIGH (ref 8–23)
CO2: 27 mmol/L (ref 22–32)
Calcium: 9.1 mg/dL (ref 8.9–10.3)
Chloride: 107 mmol/L (ref 98–111)
Creatinine, Ser: 1.46 mg/dL — ABNORMAL HIGH (ref 0.44–1.00)
GFR calc Af Amer: 35 mL/min — ABNORMAL LOW (ref >60)
GFR calc non Af Amer: 30 mL/min — ABNORMAL LOW (ref >60)
Glucose, Bld: 124 mg/dL — ABNORMAL HIGH (ref 70–99)
Potassium: 4 mmol/L (ref 3.5–5.1)
Sodium: 141 mmol/L (ref 135–145)

## 2018-12-20 LAB — LACTATE DEHYDROGENASE: LDH: 192 U/L (ref 98–192)

## 2018-12-20 LAB — VITAMIN B12: Vitamin B-12: 2759 pg/mL — ABNORMAL HIGH (ref 180–914)

## 2018-12-20 LAB — FOLATE: Folate: 14.6 ng/mL (ref >5.9)

## 2018-12-20 MED ORDER — HYDROXYUREA 500 MG PO CAPS: ORAL_CAPSULE | ORAL | 3 refills | Status: DC

## 2018-12-20 NOTE — Telephone Encounter (Signed)
Labs added.

## 2018-12-20 NOTE — Telephone Encounter (Signed)
I spoke to patient's grandson- reviewed the labs; recommend increasing hydrea to 500 mg M-F; Hold off sat-Sun. New script sent.   Please schedule CBC/CMP/LHD- MD visit in 2 months- Dr.B

## 2018-12-22 ENCOUNTER — Ambulatory Visit: Payer: Medicare Other | Admitting: Internal Medicine

## 2018-12-22 ENCOUNTER — Inpatient Hospital Stay: Payer: Medicare Other

## 2019-02-21 ENCOUNTER — Inpatient Hospital Stay: Payer: Medicare Other

## 2019-02-21 ENCOUNTER — Inpatient Hospital Stay: Payer: Medicare Other | Admitting: Internal Medicine

## 2019-03-13 ENCOUNTER — Other Ambulatory Visit: Payer: Self-pay

## 2019-03-14 ENCOUNTER — Inpatient Hospital Stay: Payer: Medicare Other | Attending: Internal Medicine

## 2019-03-14 ENCOUNTER — Other Ambulatory Visit: Payer: Self-pay

## 2019-03-14 ENCOUNTER — Other Ambulatory Visit: Payer: Self-pay | Admitting: *Deleted

## 2019-03-14 ENCOUNTER — Inpatient Hospital Stay (HOSPITAL_BASED_OUTPATIENT_CLINIC_OR_DEPARTMENT_OTHER): Payer: Medicare Other | Admitting: Internal Medicine

## 2019-03-14 DIAGNOSIS — R634 Abnormal weight loss: Secondary | ICD-10-CM

## 2019-03-14 DIAGNOSIS — D473 Essential (hemorrhagic) thrombocythemia: Secondary | ICD-10-CM | POA: Insufficient documentation

## 2019-03-14 DIAGNOSIS — N183 Chronic kidney disease, stage 3 (moderate): Secondary | ICD-10-CM

## 2019-03-14 DIAGNOSIS — I129 Hypertensive chronic kidney disease with stage 1 through stage 4 chronic kidney disease, or unspecified chronic kidney disease: Secondary | ICD-10-CM | POA: Diagnosis not present

## 2019-03-14 DIAGNOSIS — Z79899 Other long term (current) drug therapy: Secondary | ICD-10-CM | POA: Insufficient documentation

## 2019-03-14 DIAGNOSIS — D649 Anemia, unspecified: Secondary | ICD-10-CM | POA: Diagnosis not present

## 2019-03-14 DIAGNOSIS — D75839 Thrombocytosis, unspecified: Secondary | ICD-10-CM

## 2019-03-14 LAB — CBC WITH DIFFERENTIAL/PLATELET
Abs Immature Granulocytes: 0.04 10*3/uL (ref 0.00–0.07)
Basophils Absolute: 0 10*3/uL (ref 0.0–0.1)
Basophils Relative: 1 %
Eosinophils Absolute: 0.1 10*3/uL (ref 0.0–0.5)
Eosinophils Relative: 1 %
HCT: 33.6 % — ABNORMAL LOW (ref 36.0–46.0)
Hemoglobin: 10.6 g/dL — ABNORMAL LOW (ref 12.0–15.0)
Immature Granulocytes: 1 %
Lymphocytes Relative: 17 %
Lymphs Abs: 0.8 10*3/uL (ref 0.7–4.0)
MCH: 35.5 pg — ABNORMAL HIGH (ref 26.0–34.0)
MCHC: 31.5 g/dL (ref 30.0–36.0)
MCV: 112.4 fL — ABNORMAL HIGH (ref 80.0–100.0)
Monocytes Absolute: 0.3 10*3/uL (ref 0.1–1.0)
Monocytes Relative: 7 %
Neutro Abs: 3.6 10*3/uL (ref 1.7–7.7)
Neutrophils Relative %: 73 %
Platelets: 597 10*3/uL — ABNORMAL HIGH (ref 150–400)
RBC: 2.99 MIL/uL — ABNORMAL LOW (ref 3.87–5.11)
RDW: 16.2 % — ABNORMAL HIGH (ref 11.5–15.5)
WBC: 4.9 10*3/uL (ref 4.0–10.5)
nRBC: 0 % (ref 0.0–0.2)

## 2019-03-14 LAB — COMPREHENSIVE METABOLIC PANEL
ALT: 11 U/L (ref 0–44)
AST: 21 U/L (ref 15–41)
Albumin: 4 g/dL (ref 3.5–5.0)
Alkaline Phosphatase: 56 U/L (ref 38–126)
Anion gap: 8 (ref 5–15)
BUN: 26 mg/dL — ABNORMAL HIGH (ref 8–23)
CO2: 27 mmol/L (ref 22–32)
Calcium: 9.4 mg/dL (ref 8.9–10.3)
Chloride: 106 mmol/L (ref 98–111)
Creatinine, Ser: 1.47 mg/dL — ABNORMAL HIGH (ref 0.44–1.00)
GFR calc Af Amer: 34 mL/min — ABNORMAL LOW (ref >60)
GFR calc non Af Amer: 29 mL/min — ABNORMAL LOW (ref >60)
Glucose, Bld: 120 mg/dL — ABNORMAL HIGH (ref 70–99)
Potassium: 4.8 mmol/L (ref 3.5–5.1)
Sodium: 141 mmol/L (ref 135–145)
Total Bilirubin: 0.7 mg/dL (ref 0.3–1.2)
Total Protein: 7.2 g/dL (ref 6.5–8.1)

## 2019-03-14 LAB — TSH: TSH: 8.251 u[IU]/mL — ABNORMAL HIGH (ref 0.350–4.500)

## 2019-03-14 LAB — LACTATE DEHYDROGENASE: LDH: 177 U/L (ref 98–192)

## 2019-03-14 NOTE — Assessment & Plan Note (Addendum)
#   ESSENTIAL THROMBOCYTOSIS-  Jak 2 negative; currently on Hydrea Monday through Friday 1 pill a day; none on Saturday Sunday.   #Today hemoglobin 9.6/stable; platelet 597; normal white count.  Would not recommend going up on the Hydrea given the concern for worsening anemia.  # weight loss-plan explained.  Lost 12 pounds in the last 6 months; as per family patient is good appetite.  Eating well.  However discussed that if continued weight loss-read imaging like ultrasound/CT scan.  Grandson agreement.  # Poorly controlled Blood pressures- 212/81-  Chronically blood pressure-stable not elevated at home [140s/70]; as per family.  # CKD-stage III 1.47- stable.   # I spoke to pt's grandson, [name] over the phone/updated the plan of care.   # DISPOSITION: check TSH.  # follow up in 4 months- cbc/bmp/ldh/iron/ferrtin- Dr.B.

## 2019-03-14 NOTE — Progress Notes (Signed)
Called grandson [name] [phone]) to confirm current medications.  [name] reports patient is doing well with no concerns today.  Blood pressure 217/69, 207/81, 212/81.

## 2019-03-14 NOTE — Progress Notes (Signed)
[place]  Telephone:(336) [phone]  Fax:(336) [phone]     [name] DOB: [birth year]  MR#: [id number]  NID#:[id number]  Patient Care Team: Dion Body, MD as PCP - General (Family Medicine)  CHIEF COMPLAINT:  Chief Complaint  Patient presents with  . Follow-up   Oncology History Overview Note  # ESSENTIAL THROMBOCYTOSIS [Jak-2-NEG; Bcr-abl-NEG] on Hydrea 500 day.;July 2019- Hydrea 500 q OD [sec to worsening anemia]  # CKD- stage III/creat 1.2-1.4   Essential thrombocytosis (HCC)     INTERVAL HISTORY:   85 year old female patient with above history of essential thrombocytosis is here for follow-up.  Patient denies any weight loss.  Denies any loss of appetite nausea vomiting headaches.  Denies any abdominal pain.  Continues to live independently by herself.  Family helps with her daily chores.  No blood in stools no blood in stools.    Positive weight loss of 12 pounds in the last 6 months.  Good appetite.  No nausea vomiting.  No Abdominal pain. REVIEW OF SYSTEMS:   Review of Systems  Constitutional: Positive for malaise/fatigue and weight loss. Negative for chills, diaphoresis and fever.  HENT: Negative for congestion, ear discharge, ear pain, hearing loss, nosebleeds, sore throat and tinnitus.   Eyes: Negative for blurred vision, double vision, photophobia, pain, discharge and redness.  Respiratory: Negative for cough, hemoptysis, sputum production, shortness of breath, wheezing and stridor.   Cardiovascular: Negative for chest pain, palpitations, orthopnea, claudication, leg swelling and PND.  Gastrointestinal: Negative for abdominal pain, blood in stool, constipation, diarrhea, heartburn, melena, nausea and vomiting.  Genitourinary: Negative.   Musculoskeletal: Negative.   Skin: Negative.   Neurological: Negative for dizziness, tingling, focal weakness, seizures, weakness and headaches.  Endo/Heme/Allergies: Does not bruise/bleed easily.   Psychiatric/Behavioral: Negative for depression. The patient is not nervous/anxious and does not have insomnia.       PAST MEDICAL HISTORY: Past Medical History:  Diagnosis Date  . Anemia   . CAD (coronary artery disease)   . CKD (chronic kidney disease)   . Diverticulosis   . Essential thrombocytosis ([place]) 05/16/2015  . GERD (gastroesophageal reflux disease)   . GI bleed   . HTN (hypertension)   . NSTEMI (non-ST elevated myocardial infarction) ([place])   . Osteoporosis   . Thrombocytosis ([place])   . TIA (transient ischemic attack)     PAST SURGICAL HISTORY: Past Surgical History:  Procedure Laterality Date  . ABDOMINAL HYSTERECTOMY    . COLONOSCOPY      FAMILY HISTORY Family History  Problem Relation Age of Onset  . Cancer Mother   . Stroke Father     GYNECOLOGIC HISTORY:  No LMP recorded. Patient has had a hysterectomy.     ADVANCED DIRECTIVES:    HEALTH MAINTENANCE: Social History   Tobacco Use  . Smoking status: Never Smoker  . Smokeless tobacco: Never Used  Substance Use Topics  . Alcohol use: No  . Drug use: No     Allergies  Allergen Reactions  . Alendronate Sodium Rash  . Calcitonin Rash  . Cephalexin Rash  . Raloxifene Rash  . Tramadol Rash    Current Outpatient Medications  Medication Sig Dispense Refill  . acetaminophen (TYLENOL) 325 MG tablet Take 650 mg by mouth every 4 (four) hours as needed for mild pain, moderate pain or fever.     Marland Kitchen amLODipine (NORVASC) 5 MG tablet Take 5 mg by mouth daily.    Marland Kitchen aspirin EC 81 MG tablet Take 81 mg by  mouth daily.    Marland Kitchen atorvastatin (LIPITOR) 10 MG tablet Take 10 mg by mouth daily.    . furosemide (LASIX) 20 MG tablet Take 20 mg by mouth once a week. 1 tab weekly    . hydroxyurea (HYDREA) 500 MG capsule One pill a day; Monday thru Friday; HOLD on Saturday/Sunday. 90 capsule 3  . iron polysaccharides (NIFEREX) 150 MG capsule Take 150 mg by mouth daily. Alternates days with furosemide    . losartan  (COZAAR) 50 MG tablet Take 50 mg by mouth at bedtime.    . metoprolol succinate (TOPROL-XL) 50 MG 24 hr tablet Take 50 mg by mouth daily.     Marland Kitchen omeprazole (PRILOSEC) 20 MG capsule Take 1 capsule (20 mg total) by mouth 2 (two) times daily before a meal.    . potassium chloride SA (K-DUR,KLOR-CON) 20 MEQ tablet Take 20 mEq by mouth every other day. Alternates with furosemide    . vitamin B-12 (CYANOCOBALAMIN) 500 MCG tablet Take 1,000 mcg by mouth daily.      No current facility-administered medications for this visit.     OBJECTIVE: BP (!) 212/81   Pulse 60   Temp (!) 97 F (36.1 C)   Resp 16   Wt 96 lb 3.2 oz (43.6 kg)   BMI 18.18 kg/m    Body mass index is 18.18 kg/m.    ECOG FS:0 - Asymptomatic   Physical Exam  Constitutional: She is oriented to person, place, and time and well-developed, well-nourished, and in no distress.  Accompanied by her grandson.  Walking by herself.   HENT:  Head: Normocephalic and atraumatic.  Mouth/Throat: Oropharynx is clear and moist. No oropharyngeal exudate.  Eyes: Pupils are equal, round, and reactive to light.  Neck: Normal range of motion. Neck supple.  Cardiovascular: Normal rate and regular rhythm.  Pulmonary/Chest: No respiratory distress. She has no wheezes.  Abdominal: Soft. Bowel sounds are normal. She exhibits no distension and no mass. There is no abdominal tenderness. There is no rebound and no guarding.  Musculoskeletal: Normal range of motion.        General: No tenderness or edema.  Neurological: She is alert and oriented to person, place, and time.  Skin: Skin is warm.  Psychiatric: Affect normal.     LAB RESULTS:  Orders Only on 03/14/2019  Component Date Value Ref Range Status  . LDH 03/14/2019 177  98 - 192 U/L Final   Performed at [facility], [address] [place], [place] [address]  . Sodium 03/14/2019 141  135 - 145 mmol/L Final  . Potassium 03/14/2019 4.8  3.5 - 5.1 mmol/L Final  . Chloride 03/14/2019  106  98 - 111 mmol/L Final  . CO2 03/14/2019 27  22 - 32 mmol/L Final  . Glucose, Bld 03/14/2019 120* 70 - 99 mg/dL Final  . BUN 03/14/2019 26* 8 - 23 mg/dL Final  . Creatinine, Ser 03/14/2019 1.47* 0.44 - 1.00 mg/dL Final  . Calcium 03/14/2019 9.4  8.9 - 10.3 mg/dL Final  . Total Protein 03/14/2019 7.2  6.5 - 8.1 g/dL Final  . Albumin 03/14/2019 4.0  3.5 - 5.0 g/dL Final  . AST 03/14/2019 21  15 - 41 U/L Final  . ALT 03/14/2019 11  0 - 44 U/L Final  . Alkaline Phosphatase 03/14/2019 56  38 - 126 U/L Final  . Total Bilirubin 03/14/2019 0.7  0.3 - 1.2 mg/dL Final  . GFR calc non Af Amer 03/14/2019 29* >60 mL/min Final  .  GFR calc Af Amer 03/14/2019 34* >60 mL/min Final  . Anion gap 03/14/2019 8  5 - 15 Final   Performed at [facility], [address] [place], [place] [address]  . WBC 03/14/2019 4.9  4.0 - 10.5 K/uL Final  . RBC 03/14/2019 2.99* 3.87 - 5.11 MIL/uL Final  . Hemoglobin 03/14/2019 10.6* 12.0 - 15.0 g/dL Final  . HCT 03/14/2019 33.6* 36.0 - 46.0 % Final  . MCV 03/14/2019 112.4* 80.0 - 100.0 fL Final  . MCH 03/14/2019 35.5* 26.0 - 34.0 pg Final  . MCHC 03/14/2019 31.5  30.0 - 36.0 g/dL Final  . RDW 03/14/2019 16.2* 11.5 - 15.5 % Final  . Platelets 03/14/2019 597* 150 - 400 K/uL Final  . nRBC 03/14/2019 0.0  0.0 - 0.2 % Final  . Neutrophils Relative % 03/14/2019 73  % Final  . Neutro Abs 03/14/2019 3.6  1.7 - 7.7 K/uL Final  . Lymphocytes Relative 03/14/2019 17  % Final  . Lymphs Abs 03/14/2019 0.8  0.7 - 4.0 K/uL Final  . Monocytes Relative 03/14/2019 7  % Final  . Monocytes Absolute 03/14/2019 0.3  0.1 - 1.0 K/uL Final  . Eosinophils Relative 03/14/2019 1  % Final  . Eosinophils Absolute 03/14/2019 0.1  0.0 - 0.5 K/uL Final  . Basophils Relative 03/14/2019 1  % Final  . Basophils Absolute 03/14/2019 0.0  0.0 - 0.1 K/uL Final  . Immature Granulocytes 03/14/2019 1  % Final  . Abs Immature Granulocytes 03/14/2019 0.04  0.00 - 0.07 K/uL Final   Performed at [facility], [address] [place], [place] [address]    STUDIES: No results found.  ASSESSMENT:  Essential thrombocytosis  Essential thrombocytosis ([place]) # ESSENTIAL THROMBOCYTOSIS-  Jak 2 negative; currently on Hydrea Monday through Friday 1 pill a day; none on Saturday Sunday.   #Today hemoglobin 9.6/stable; platelet 597; normal white count.  Would not recommend going up on the Hydrea given the concern for worsening anemia.  # weight loss-plan explained.  Lost 12 pounds in the last 6 months; as per family patient is good appetite.  Eating well.  However discussed that if continued weight loss-read imaging like ultrasound/CT scan.  Grandson agreement.  # Poorly controlled Blood pressures- 212/81-  Chronically blood pressure-stable not elevated at home [140s/70]; as per family.  # CKD-stage III 1.47- stable.   # I spoke to pt's grandson, [name] over the phone/updated the plan of care.   # DISPOSITION: check TSH.  # follow up in 4 months- cbc/bmp/ldh/iron/ferrtin- Dr.B.    Cammie Sickle, MD   03/14/2019 11:32 AM

## 2019-07-16 ENCOUNTER — Other Ambulatory Visit: Payer: Self-pay

## 2019-07-16 DIAGNOSIS — D75839 Thrombocytosis, unspecified: Secondary | ICD-10-CM

## 2019-07-16 DIAGNOSIS — D473 Essential (hemorrhagic) thrombocythemia: Secondary | ICD-10-CM

## 2019-07-17 ENCOUNTER — Other Ambulatory Visit: Payer: Self-pay

## 2019-07-18 ENCOUNTER — Encounter: Payer: Self-pay | Admitting: Internal Medicine

## 2019-07-18 ENCOUNTER — Inpatient Hospital Stay (HOSPITAL_BASED_OUTPATIENT_CLINIC_OR_DEPARTMENT_OTHER): Payer: Medicare Other | Admitting: Internal Medicine

## 2019-07-18 ENCOUNTER — Inpatient Hospital Stay: Payer: Medicare Other | Attending: Internal Medicine

## 2019-07-18 ENCOUNTER — Other Ambulatory Visit: Payer: Self-pay

## 2019-07-18 VITALS — BP 145/75 | HR 101 | Temp 98.0°F | Wt 94.0 lb

## 2019-07-18 DIAGNOSIS — R634 Abnormal weight loss: Secondary | ICD-10-CM | POA: Diagnosis not present

## 2019-07-18 DIAGNOSIS — D473 Essential (hemorrhagic) thrombocythemia: Secondary | ICD-10-CM | POA: Insufficient documentation

## 2019-07-18 DIAGNOSIS — D75839 Thrombocytosis, unspecified: Secondary | ICD-10-CM

## 2019-07-18 DIAGNOSIS — I129 Hypertensive chronic kidney disease with stage 1 through stage 4 chronic kidney disease, or unspecified chronic kidney disease: Secondary | ICD-10-CM | POA: Insufficient documentation

## 2019-07-18 DIAGNOSIS — Z79899 Other long term (current) drug therapy: Secondary | ICD-10-CM | POA: Diagnosis not present

## 2019-07-18 DIAGNOSIS — N183 Chronic kidney disease, stage 3 unspecified: Secondary | ICD-10-CM | POA: Insufficient documentation

## 2019-07-18 DIAGNOSIS — D649 Anemia, unspecified: Secondary | ICD-10-CM | POA: Diagnosis not present

## 2019-07-18 LAB — IRON AND TIBC
Iron: 72 ug/dL (ref 28–170)
Saturation Ratios: 22 % (ref 10.4–31.8)
TIBC: 327 ug/dL (ref 250–450)
UIBC: 255 ug/dL

## 2019-07-18 LAB — CBC WITH DIFFERENTIAL/PLATELET
Abs Immature Granulocytes: 0.03 10*3/uL (ref 0.00–0.07)
Basophils Absolute: 0 10*3/uL (ref 0.0–0.1)
Basophils Relative: 0 %
Eosinophils Absolute: 0 10*3/uL (ref 0.0–0.5)
Eosinophils Relative: 1 %
HCT: 34.7 % — ABNORMAL LOW (ref 36.0–46.0)
Hemoglobin: 10.8 g/dL — ABNORMAL LOW (ref 12.0–15.0)
Immature Granulocytes: 0 %
Lymphocytes Relative: 14 %
Lymphs Abs: 1 10*3/uL (ref 0.7–4.0)
MCH: 35.1 pg — ABNORMAL HIGH (ref 26.0–34.0)
MCHC: 31.1 g/dL (ref 30.0–36.0)
MCV: 112.7 fL — ABNORMAL HIGH (ref 80.0–100.0)
Monocytes Absolute: 0.4 10*3/uL (ref 0.1–1.0)
Monocytes Relative: 5 %
Neutro Abs: 5.5 10*3/uL (ref 1.7–7.7)
Neutrophils Relative %: 80 %
Platelets: 735 10*3/uL — ABNORMAL HIGH (ref 150–400)
RBC: 3.08 MIL/uL — ABNORMAL LOW (ref 3.87–5.11)
RDW: 14.2 % (ref 11.5–15.5)
WBC: 6.9 10*3/uL (ref 4.0–10.5)
nRBC: 0 % (ref 0.0–0.2)

## 2019-07-18 LAB — FERRITIN: Ferritin: 35 ng/mL (ref 11–307)

## 2019-07-18 LAB — LACTATE DEHYDROGENASE: LDH: 169 U/L (ref 98–192)

## 2019-07-18 LAB — BASIC METABOLIC PANEL
Anion gap: 6 (ref 5–15)
BUN: 23 mg/dL (ref 8–23)
CO2: 28 mmol/L (ref 22–32)
Calcium: 9.3 mg/dL (ref 8.9–10.3)
Chloride: 105 mmol/L (ref 98–111)
Creatinine, Ser: 1.16 mg/dL — ABNORMAL HIGH (ref 0.44–1.00)
GFR calc Af Amer: 45 mL/min — ABNORMAL LOW (ref >60)
GFR calc non Af Amer: 39 mL/min — ABNORMAL LOW (ref >60)
Glucose, Bld: 133 mg/dL — ABNORMAL HIGH (ref 70–99)
Potassium: 4 mmol/L (ref 3.5–5.1)
Sodium: 139 mmol/L (ref 135–145)

## 2019-07-18 LAB — TSH: TSH: 4.994 u[IU]/mL — ABNORMAL HIGH (ref 0.350–4.500)

## 2019-07-18 NOTE — Progress Notes (Signed)
[place]  Telephone:(336) [phone]  Fax:(336) [phone]     [name] DOB: May 28, 1919  MR#: [id number]  QVZ#:[id number]  Patient Care Team: Dion Body, MD as PCP - General (Family Medicine)  CHIEF COMPLAINT:  Chief Complaint  Patient presents with  . thrombocytosis   Oncology History Overview Note  # ESSENTIAL THROMBOCYTOSIS [Jak-2-NEG; Bcr-abl-NEG] on Hydrea 500 day.;July 2019- Hydrea 500 q OD [sec to worsening anemia]  # CKD- stage III/creat 1.2-1.4   Essential thrombocytosis (HCC)     INTERVAL HISTORY:   85 year old female patient with above history of essential thrombocytosis is here for follow-up.  Patient has lost 12 pounds in the last 6 months also.  States her appetite is good.  However, unclear reasons continues weight.  Denies any abdominal pain.  Denies any night sweats.  Continues to live independently by herself.  Family helps with her daily chores.  No blood in stools no blood in stools.    REVIEW OF SYSTEMS:   Review of Systems  Constitutional: Positive for malaise/fatigue and weight loss. Negative for chills, diaphoresis and fever.  HENT: Negative for congestion, ear discharge, ear pain, hearing loss, nosebleeds, sore throat and tinnitus.   Eyes: Negative for blurred vision, double vision, photophobia, pain, discharge and redness.  Respiratory: Negative for cough, hemoptysis, sputum production, shortness of breath, wheezing and stridor.   Cardiovascular: Negative for chest pain, palpitations, orthopnea, claudication, leg swelling and PND.  Gastrointestinal: Negative for abdominal pain, blood in stool, constipation, diarrhea, heartburn, melena, nausea and vomiting.  Genitourinary: Negative.   Musculoskeletal: Negative.   Skin: Negative.   Neurological: Negative for dizziness, tingling, focal weakness, seizures, weakness and headaches.  Endo/Heme/Allergies: Does not bruise/bleed easily.  Psychiatric/Behavioral: Negative for  depression. The patient is not nervous/anxious and does not have insomnia.       PAST MEDICAL HISTORY: Past Medical History:  Diagnosis Date  . Anemia   . CAD (coronary artery disease)   . CKD (chronic kidney disease)   . Diverticulosis   . Essential thrombocytosis ([place]) 05/16/2015  . GERD (gastroesophageal reflux disease)   . GI bleed   . HTN (hypertension)   . NSTEMI (non-ST elevated myocardial infarction) ([place])   . Osteoporosis   . Thrombocytosis ([place])   . TIA (transient ischemic attack)     PAST SURGICAL HISTORY: Past Surgical History:  Procedure Laterality Date  . ABDOMINAL HYSTERECTOMY    . COLONOSCOPY      FAMILY HISTORY Family History  Problem Relation Age of Onset  . Cancer Mother   . Stroke Father     GYNECOLOGIC HISTORY:  No LMP recorded. Patient has had a hysterectomy.     ADVANCED DIRECTIVES:    HEALTH MAINTENANCE: Social History   Tobacco Use  . Smoking status: Never Smoker  . Smokeless tobacco: Never Used  Substance Use Topics  . Alcohol use: No  . Drug use: No     Allergies  Allergen Reactions  . Alendronate Sodium Rash  . Calcitonin Rash  . Cephalexin Rash  . Raloxifene Rash  . Tramadol Rash    Current Outpatient Medications  Medication Sig Dispense Refill  . acetaminophen (TYLENOL) 325 MG tablet Take 650 mg by mouth every 4 (four) hours as needed for mild pain, moderate pain or fever.     Marland Kitchen amLODipine (NORVASC) 5 MG tablet Take 5 mg by mouth daily.    Marland Kitchen aspirin EC 81 MG tablet Take 81 mg by mouth daily.    Marland Kitchen atorvastatin (LIPITOR)  10 MG tablet Take 10 mg by mouth daily.    . furosemide (LASIX) 20 MG tablet Take 20 mg by mouth once a week. 1 tab weekly    . hydroxyurea (HYDREA) 500 MG capsule One pill a day; Monday thru Friday; HOLD on Saturday/Sunday. 90 capsule 3  . iron polysaccharides (NIFEREX) 150 MG capsule Take 150 mg by mouth daily. Alternates days with furosemide    . losartan (COZAAR) 50 MG tablet Take 50 mg by mouth at  bedtime.    . metoprolol succinate (TOPROL-XL) 50 MG 24 hr tablet Take 50 mg by mouth daily.     Marland Kitchen omeprazole (PRILOSEC) 20 MG capsule Take 1 capsule (20 mg total) by mouth 2 (two) times daily before a meal.    . potassium chloride SA (K-DUR,KLOR-CON) 20 MEQ tablet Take 20 mEq by mouth every other day. Alternates with furosemide    . vitamin B-12 (CYANOCOBALAMIN) 500 MCG tablet Take 1,000 mcg by mouth daily.      No current facility-administered medications for this visit.     OBJECTIVE: BP (!) 145/75 (BP Location: Right Arm, Patient Position: Sitting, Cuff Size: Normal)   Pulse (!) 101   Temp 98 F (36.7 C) (Tympanic)   Wt 94 lb (42.6 kg)   BMI 17.76 kg/m    Body mass index is 17.76 kg/m.    ECOG FS:0 - Asymptomatic   Physical Exam  Constitutional: She is oriented to person, place, and time and well-developed, well-nourished, and in no distress.  Accompanied by her grandson.  Walking by herself.   HENT:  Head: Normocephalic and atraumatic.  Mouth/Throat: Oropharynx is clear and moist. No oropharyngeal exudate.  Eyes: Pupils are equal, round, and reactive to light.  Neck: Normal range of motion. Neck supple.  Cardiovascular: Normal rate and regular rhythm.  Pulmonary/Chest: No respiratory distress. She has no wheezes.  Abdominal: Soft. Bowel sounds are normal. She exhibits no distension and no mass. There is no abdominal tenderness. There is no rebound and no guarding.  Musculoskeletal: Normal range of motion.        General: No tenderness or edema.  Neurological: She is alert and oriented to person, place, and time.  Skin: Skin is warm.  Psychiatric: Affect normal.     LAB RESULTS:  Appointment on 07/18/2019  Component Date Value Ref Range Status  . Iron 07/18/2019 72  28 - 170 ug/dL Final  . TIBC 07/18/2019 327  250 - 450 ug/dL Final  . Saturation Ratios 07/18/2019 22  10.4 - 31.8 % Final  . UIBC 07/18/2019 255  ug/dL Final   Performed at [facility], [address], [place], [place] [address]  . LDH 07/18/2019 169  98 - 192 U/L Final   Performed at [facility], [address] [place], [place] [address]  . Ferritin 07/18/2019 35  11 - 307 ng/mL Final   Performed at [facility], [address] [place], [place] [address]  . Sodium 07/18/2019 139  135 - 145 mmol/L Final  . Potassium 07/18/2019 4.0  3.5 - 5.1 mmol/L Final  . Chloride 07/18/2019 105  98 - 111 mmol/L Final  . CO2 07/18/2019 28  22 - 32 mmol/L Final  . Glucose, Bld 07/18/2019 133* 70 - 99 mg/dL Final  . BUN 07/18/2019 23  8 - 23 mg/dL Final  . Creatinine, Ser 07/18/2019 1.16* 0.44 - 1.00 mg/dL Final  . Calcium 07/18/2019 9.3  8.9 - 10.3 mg/dL Final  . GFR  calc non Af Amer 07/18/2019 39* >60 mL/min Final  . GFR calc Af Amer 07/18/2019 45* >60 mL/min Final  . Anion gap 07/18/2019 6  5 - 15 Final   Performed at [facility], [address], [place], [place] [address]  . WBC 07/18/2019 6.9  4.0 - 10.5 K/uL Final  . RBC 07/18/2019 3.08* 3.87 - 5.11 MIL/uL Final  . Hemoglobin 07/18/2019 10.8* 12.0 - 15.0 g/dL Final  . HCT 07/18/2019 34.7* 36.0 - 46.0 % Final  . MCV 07/18/2019 112.7* 80.0 - 100.0 fL Final  . MCH 07/18/2019 35.1* 26.0 - 34.0 pg Final  . MCHC 07/18/2019 31.1  30.0 - 36.0 g/dL Final  . RDW 07/18/2019 14.2  11.5 - 15.5 % Final  . Platelets 07/18/2019 735* 150 - 400 K/uL Final  . nRBC 07/18/2019 0.0  0.0 - 0.2 % Final  . Neutrophils Relative % 07/18/2019 80  % Final  . Neutro Abs 07/18/2019 5.5  1.7 - 7.7 K/uL Final  . Lymphocytes Relative 07/18/2019 14  % Final  . Lymphs Abs 07/18/2019 1.0  0.7 - 4.0 K/uL Final  . Monocytes Relative 07/18/2019 5  % Final  . Monocytes Absolute 07/18/2019 0.4  0.1 - 1.0 K/uL Final  . Eosinophils Relative 07/18/2019 1  % Final  . Eosinophils Absolute 07/18/2019 0.0  0.0 - 0.5 K/uL Final  . Basophils Relative 07/18/2019 0  % Final  . Basophils Absolute 07/18/2019 0.0  0.0 - 0.1 K/uL Final  . Immature  Granulocytes 07/18/2019 0  % Final  . Abs Immature Granulocytes 07/18/2019 0.03  0.00 - 0.07 K/uL Final   Performed at [facility], [address], [place], [place] [address]  . TSH 07/18/2019 4.994* 0.350 - 4.500 uIU/mL Final   Comment: Performed by a 3rd Generation assay with a functional sensitivity of <=0.01 uIU/mL. Performed at [facility], [address], [place],  [address]     STUDIES: No results found.  ASSESSMENT:  Essential thrombocytosis  Essential thrombocytosis ([place]) # ESSENTIAL THROMBOCYTOSIS-  Jak 2 negative; currently on Hydrea Monday through Friday 1 pill a day; none on Saturday Sunday.   # However, today hemoglobin 10.5 stable; platelet 735 normal white count.  Would not recommend going up on the Hydrea given the concern for worsening anemia.  # weight loss-plan explained.  Lost 12 pounds in the last 6 months; as per family patient is good appetite.   # Poorly controlled Blood pressures- 140/70s-  STABLE. [ at home [140s/70 as per family].  # CKD-stage III 1.3;STABLE.    # I spoke to pt's grandson, [name] /updated the plan of care.   # DISPOSITION:  # referral to [facility]- weight loss.  # follow up in 6 months- cbc/cmp/ldh- Dr.B.    Cammie Sickle, MD   08/01/2019 7:41 AM

## 2019-07-18 NOTE — Assessment & Plan Note (Addendum)
#   ESSENTIAL THROMBOCYTOSIS-  Jak 2 negative; currently on Hydrea Monday through Friday 1 pill a day; none on Saturday Sunday.   # However, today hemoglobin 10.5 stable; platelet 735 normal white count.  Would not recommend going up on the Hydrea given the concern for worsening anemia.  # weight loss-plan explained.  Lost 12 pounds in the last 6 months; as per family patient is good appetite.   # Poorly controlled Blood pressures- 140/70s-  STABLE. [ at home [140s/70 as per family].  # CKD-stage III 1.3;STABLE.    # I spoke to pt's grandson, [name] /updated the plan of care.   # DISPOSITION:  # referral to [facility]- weight loss.  # follow up in 6 months- cbc/cmp/ldh- Dr.B.

## 2019-07-19 ENCOUNTER — Other Ambulatory Visit: Payer: Self-pay

## 2019-07-19 ENCOUNTER — Emergency Department: Payer: Medicare Other

## 2019-07-19 ENCOUNTER — Observation Stay
Admission: EM | Admit: 2019-07-19 | Discharge: 2019-07-19 | Disposition: A | Discharge status: Home / Self Care | Payer: Medicare Other | Attending: Emergency Medicine | Admitting: Emergency Medicine

## 2019-07-19 ENCOUNTER — Encounter: Payer: Self-pay | Admitting: Emergency Medicine

## 2019-07-19 DIAGNOSIS — I252 Old myocardial infarction: Secondary | ICD-10-CM | POA: Insufficient documentation

## 2019-07-19 DIAGNOSIS — R7989 Other specified abnormal findings of blood chemistry: Secondary | ICD-10-CM | POA: Diagnosis not present

## 2019-07-19 DIAGNOSIS — R251 Tremor, unspecified: Secondary | ICD-10-CM | POA: Diagnosis not present

## 2019-07-19 DIAGNOSIS — Z809 Family history of malignant neoplasm, unspecified: Secondary | ICD-10-CM | POA: Diagnosis not present

## 2019-07-19 DIAGNOSIS — R531 Weakness: Secondary | ICD-10-CM | POA: Diagnosis not present

## 2019-07-19 DIAGNOSIS — Z7982 Long term (current) use of aspirin: Secondary | ICD-10-CM | POA: Insufficient documentation

## 2019-07-19 DIAGNOSIS — I2510 Atherosclerotic heart disease of native coronary artery without angina pectoris: Secondary | ICD-10-CM | POA: Insufficient documentation

## 2019-07-19 DIAGNOSIS — Z79899 Other long term (current) drug therapy: Secondary | ICD-10-CM | POA: Insufficient documentation

## 2019-07-19 DIAGNOSIS — R569 Unspecified convulsions: Principal | ICD-10-CM

## 2019-07-19 DIAGNOSIS — Z885 Allergy status to narcotic agent status: Secondary | ICD-10-CM | POA: Insufficient documentation

## 2019-07-19 DIAGNOSIS — Z888 Allergy status to other drugs, medicaments and biological substances status: Secondary | ICD-10-CM | POA: Diagnosis not present

## 2019-07-19 DIAGNOSIS — Z823 Family history of stroke: Secondary | ICD-10-CM | POA: Insufficient documentation

## 2019-07-19 DIAGNOSIS — I6782 Cerebral ischemia: Secondary | ICD-10-CM | POA: Diagnosis not present

## 2019-07-19 DIAGNOSIS — I129 Hypertensive chronic kidney disease with stage 1 through stage 4 chronic kidney disease, or unspecified chronic kidney disease: Secondary | ICD-10-CM | POA: Diagnosis not present

## 2019-07-19 DIAGNOSIS — N184 Chronic kidney disease, stage 4 (severe): Secondary | ICD-10-CM | POA: Insufficient documentation

## 2019-07-19 DIAGNOSIS — G459 Transient cerebral ischemic attack, unspecified: Secondary | ICD-10-CM | POA: Diagnosis present

## 2019-07-19 DIAGNOSIS — Z8673 Personal history of transient ischemic attack (TIA), and cerebral infarction without residual deficits: Secondary | ICD-10-CM | POA: Insufficient documentation

## 2019-07-19 DIAGNOSIS — M810 Age-related osteoporosis without current pathological fracture: Secondary | ICD-10-CM | POA: Insufficient documentation

## 2019-07-19 DIAGNOSIS — Z881 Allergy status to other antibiotic agents status: Secondary | ICD-10-CM | POA: Diagnosis not present

## 2019-07-19 DIAGNOSIS — K219 Gastro-esophageal reflux disease without esophagitis: Secondary | ICD-10-CM | POA: Insufficient documentation

## 2019-07-19 DIAGNOSIS — Z90710 Acquired absence of both cervix and uterus: Secondary | ICD-10-CM | POA: Insufficient documentation

## 2019-07-19 LAB — PROTIME-INR
INR: 1.1 (ref 0.8–1.2)
Prothrombin Time: 14.1 seconds (ref 11.4–15.2)

## 2019-07-19 LAB — COMPREHENSIVE METABOLIC PANEL
ALT: 12 U/L (ref 0–44)
AST: 19 U/L (ref 15–41)
Albumin: 3.6 g/dL (ref 3.5–5.0)
Alkaline Phosphatase: 49 U/L (ref 38–126)
Anion gap: 13 (ref 5–15)
BUN: 29 mg/dL — ABNORMAL HIGH (ref 8–23)
CO2: 26 mmol/L (ref 22–32)
Calcium: 9.1 mg/dL (ref 8.9–10.3)
Chloride: 100 mmol/L (ref 98–111)
Creatinine, Ser: 1.39 mg/dL — ABNORMAL HIGH (ref 0.44–1.00)
GFR calc Af Amer: 36 mL/min — ABNORMAL LOW (ref >60)
GFR calc non Af Amer: 31 mL/min — ABNORMAL LOW (ref >60)
Glucose, Bld: 124 mg/dL — ABNORMAL HIGH (ref 70–99)
Potassium: 4.2 mmol/L (ref 3.5–5.1)
Sodium: 139 mmol/L (ref 135–145)
Total Bilirubin: 0.8 mg/dL (ref 0.3–1.2)
Total Protein: 6.3 g/dL — ABNORMAL LOW (ref 6.5–8.1)

## 2019-07-19 LAB — DIFFERENTIAL
Abs Immature Granulocytes: 0.02 10*3/uL (ref 0.00–0.07)
Basophils Absolute: 0 10*3/uL (ref 0.0–0.1)
Basophils Relative: 0 %
Eosinophils Absolute: 0 10*3/uL (ref 0.0–0.5)
Eosinophils Relative: 0 %
Immature Granulocytes: 0 %
Lymphocytes Relative: 12 %
Lymphs Abs: 0.9 10*3/uL (ref 0.7–4.0)
Monocytes Absolute: 0.4 10*3/uL (ref 0.1–1.0)
Monocytes Relative: 5 %
Neutro Abs: 6 10*3/uL (ref 1.7–7.7)
Neutrophils Relative %: 83 %

## 2019-07-19 LAB — CBC
HCT: 30.1 % — ABNORMAL LOW (ref 36.0–46.0)
Hemoglobin: 9.5 g/dL — ABNORMAL LOW (ref 12.0–15.0)
MCH: 35.8 pg — ABNORMAL HIGH (ref 26.0–34.0)
MCHC: 31.6 g/dL (ref 30.0–36.0)
MCV: 113.6 fL — ABNORMAL HIGH (ref 80.0–100.0)
Platelets: 602 10*3/uL — ABNORMAL HIGH (ref 150–400)
RBC: 2.65 MIL/uL — ABNORMAL LOW (ref 3.87–5.11)
RDW: 14.1 % (ref 11.5–15.5)
WBC: 7.3 10*3/uL (ref 4.0–10.5)
nRBC: 0 % (ref 0.0–0.2)

## 2019-07-19 LAB — APTT: aPTT: 27 seconds (ref 24–36)

## 2019-07-19 LAB — ETHANOL: Alcohol, Ethyl (B): <10 mg/dL (ref <10)

## 2019-07-19 NOTE — Consult Note (Signed)
Reason for Consult:Seizure like activity Referring Physician: Ellender Hose  CC: Seizure like activity  HPI: [name] is an 85 y.o. female who was at home with her grandson who at 28 was noted to have jerking of her right leg and arm.  Stared into space and was unresponsive for a short period of time as well.  Afterward was noted to have left sided weakness.  He did not note facial droop but noted left upper extremity drift.  Patient was brought in for evaluation.  Is currently at baseline and wants to go home.   Lives alone with family close.  Meds handles by family.  Patient does still cook.    Past Medical History:  Diagnosis Date  . Anemia   . CAD (coronary artery disease)   . CKD (chronic kidney disease)   . Diverticulosis   . Essential thrombocytosis ([place]) 05/16/2015  . GERD (gastroesophageal reflux disease)   . GI bleed   . HTN (hypertension)   . NSTEMI (non-ST elevated myocardial infarction) ([place])   . Osteoporosis   . Thrombocytosis ([place])   . TIA (transient ischemic attack)     Past Surgical History:  Procedure Laterality Date  . ABDOMINAL HYSTERECTOMY    . COLONOSCOPY      Family History  Problem Relation Age of Onset  . Cancer Mother   . Stroke Father     Social History:  reports that she has never smoked. She has never used smokeless tobacco. She reports that she does not drink alcohol or use drugs.  Allergies  Allergen Reactions  . Alendronate Sodium Rash  . Calcitonin Rash  . Cephalexin Rash  . Raloxifene Rash  . Tramadol Rash    Medications: I have reviewed the patient's current medications. Prior to Admission medications   Medication Sig Start Date End Date Taking? Authorizing Provider  acetaminophen (TYLENOL) 325 MG tablet Take 650 mg by mouth every 4 (four) hours as needed for mild pain, moderate pain or fever.     [provider]  amLODipine (NORVASC) 5 MG tablet Take 5 mg by mouth daily.    [provider]  aspirin EC 81 MG tablet  Take 81 mg by mouth daily.    [provider]  atorvastatin (LIPITOR) 10 MG tablet Take 10 mg by mouth daily.    [provider]  furosemide (LASIX) 20 MG tablet Take 20 mg by mouth once a week. 1 tab weekly    [provider]  hydroxyurea (HYDREA) 500 MG capsule One pill a day; Monday thru Friday; HOLD on Saturday/Sunday. 12/20/18   Cammie Sickle, MD  iron polysaccharides (NIFEREX) 150 MG capsule Take 150 mg by mouth daily. Alternates days with furosemide    [provider]  losartan (COZAAR) 50 MG tablet Take 50 mg by mouth at bedtime.    [provider]  metoprolol succinate (TOPROL-XL) 50 MG 24 hr tablet Take 50 mg by mouth daily.     [provider]  omeprazole (PRILOSEC) 20 MG capsule Take 1 capsule (20 mg total) by mouth 2 (two) times daily before a meal. 04/22/15   Leslye Peer, Richard, MD  potassium chloride SA (K-DUR,KLOR-CON) 20 MEQ tablet Take 20 mEq by mouth every other day. Alternates with furosemide    [provider]  vitamin B-12 (CYANOCOBALAMIN) 500 MCG tablet Take 1,000 mcg by mouth daily.     [provider]    ROS: History obtained from the patient  General ROS: negative for -  chills, fatigue, fever, night sweats, weight gain or weight loss Psychological ROS: negative for - behavioral disorder, hallucinations, memory difficulties, mood swings or suicidal ideation Ophthalmic ROS: negative for - blurry vision, double vision, eye pain or loss of vision ENT ROS: HOH Allergy and Immunology ROS: negative for - hives or itchy/watery eyes Hematological and Lymphatic ROS: negative for - bleeding problems, bruising or swollen lymph nodes Endocrine ROS: negative for - galactorrhea, hair pattern changes, polydipsia/polyuria or temperature intolerance Respiratory ROS: negative for - cough, hemoptysis, shortness of breath or wheezing Cardiovascular ROS: negative for - chest pain, dyspnea on exertion, edema or  irregular heartbeat Gastrointestinal ROS: negative for - abdominal pain, diarrhea, hematemesis, nausea/vomiting or stool incontinence Genito-Urinary ROS: negative for - dysuria, hematuria, incontinence or urinary frequency/urgency Musculoskeletal ROS: negative for - joint swelling or muscular weakness Neurological ROS: as noted in HPI Dermatological ROS: negative for rash and skin lesion changes  Physical Examination: Blood pressure (!) 132/48, pulse 70, temperature 98 F (36.7 C), resp. rate 18, height 5\' 5"  (1.651 m), weight 42.6 kg, SpO2 96 %.  HEENT-  Normocephalic, no lesions, without obvious abnormality.  Normal external eye and conjunctiva.  Normal TM's bilaterally.  Normal auditory canals and external ears. Normal external nose, mucus membranes and septum.  Normal pharynx. Cardiovascular- S1, S2 normal, pulses palpable throughout   Lungs- chest clear, no wheezing, rales, normal symmetric air entry Abdomen- soft, non-tender; bowel sounds normal; no masses,  no organomegaly Extremities- no edema Lymph-no adenopathy palpable Musculoskeletal-no joint tenderness, deformity or swelling Skin-warm and dry, no hyperpigmentation, vitiligo, or suspicious lesions  Neurological Examination   Mental Status: Alert, oriented, thought content appropriate.  Speech fluent without evidence of aphasia.  Able to follow 3 step commands without difficulty. Cranial Nerves: II: Visual fields grossly normal, pupils equal, round, reactive to light and accommodation III,IV, VI: ptosis not present, extra-ocular motions intact bilaterally V,VII: decrease in left NLF, facial light touch sensation normal bilaterally VIII: hearing normal bilaterally IX,X: gag reflex present XI: bilateral shoulder shrug XII: midline tongue extension Motor: Able to lift all extremities against gravity with no focal weakness noted but lower extremities weaker than upper extremities Sensory: Pinprick and light touch intact  throughout, bilaterally Deep Tendon Reflexes: Symmetric throughout Plantars: Right: mute   Left: mute Cerebellar: Normal finger-to-nose testing bilaterally.  Tremor noted in the upper extremities, right greater than left Gait: not tested due to safety concerns   Laboratory Studies:   Basic Metabolic Panel: Recent Labs  Lab 07/18/19 1305 07/19/19 1452  NA 139 139  K 4.0 4.2  CL 105 100  CO2 28 26  GLUCOSE 133* 124*  BUN 23 29*  CREATININE 1.16* 1.39*  CALCIUM 9.3 9.1    Liver Function Tests: Recent Labs  Lab 07/19/19 1452  AST 19  ALT 12  ALKPHOS 49  BILITOT 0.8  PROT 6.3*  ALBUMIN 3.6   No results for input(s): LIPASE, AMYLASE in the last 168 hours. No results for input(s): AMMONIA in the last 168 hours.  CBC: Recent Labs  Lab 07/18/19 1305 07/19/19 1452  WBC 6.9 7.3  NEUTROABS 5.5 6.0  HGB 10.8* 9.5*  HCT 34.7* 30.1*  MCV 112.7* 113.6*  PLT 735* 602*    Cardiac Enzymes: No results for input(s): CKTOTAL, CKMB, CKMBINDEX, TROPONINI in the last 168 hours.  BNP: Invalid input(s): POCBNP  CBG: No results for input(s): GLUCAP in the last 168 hours.  Microbiology: No results found for this or any previous visit.  Coagulation Studies:  Recent Labs    07/19/19 1452  LABPROT 14.1  INR 1.1    Urinalysis: No results for input(s): COLORURINE, LABSPEC, PHURINE, GLUCOSEU, HGBUR, BILIRUBINUR, KETONESUR, PROTEINUR, UROBILINOGEN, NITRITE, LEUKOCYTESUR in the last 168 hours.  Invalid input(s): APPERANCEUR  Lipid Panel:     Component Value Date/Time   CHOL 124 09/07/2012 0450   TRIG 148 09/07/2012 0450   HDL 39 (L) 09/07/2012 0450   VLDL 30 09/07/2012 0450   LDLCALC 55 09/07/2012 0450    HgbA1C: No results found for: HGBA1C  Urine Drug Screen:  No results found for: LABOPIA, COCAINSCRNUR, LABBENZ, AMPHETMU, THCU, LABBARB  Alcohol Level:  Recent Labs  Lab 07/19/19 [place] <10    Other results: EKG: sinus rhythm at 70 bpm.  Imaging: Ct  Head Wo Contrast  Result Date: 07/19/2019 CLINICAL DATA:  Intermittent left-sided weakness EXAM: CT HEAD WITHOUT CONTRAST TECHNIQUE: Contiguous axial images were obtained from the base of the skull through the vertex without intravenous contrast. COMPARISON:  06/03/2014 FINDINGS: Brain: Mild atrophic changes and chronic white matter ischemic changes are seen. Changes of prior right cerebellar infarct new from the prior exam are seen. No findings to suggest acute hemorrhage or space-occupying mass lesion are seen. Vascular: No hyperdense vessel or unexpected calcification. Skull: Normal. Negative for fracture or focal lesion. Sinuses/Orbits: No acute finding. Other: None. IMPRESSION: Chronic atrophic changes and white matter ischemic changes without acute abnormality. Prior right cerebellar infarct. Electronically Signed   By: Inez Catalina M.D.   On: 07/19/2019 15:17     Assessment/Plan: 85 year old female with a history of CAD, HTN, TIA and GIB on ASA who presents with seizure like activity and left sided weakness that has resolved.  Patient now at baseline.  Head CT reviewed and shows no acute changes but evidence of chronic right cerebellar infarct and white matter ischemic changes.  Can not rule out a recurrent TIA versus seizure.  Family wishes outpatient work up.  Would not start anticonvulsant therapy at this time.  Recommendations: 1. EEG 2. MRI of the brain without contrast 3. Follow up with outpatient PCP who can refer to neurology if above testing abnormal and/or further seizure like events deeming anticonvulsant therapy more appropriate.    Case discussed with Dr. Roney Jaffe, MD Neurology [phone] 07/19/2019, 3:56 PM

## 2019-07-19 NOTE — Discharge Instructions (Signed)
As we discussed, your symptoms today could be due to a seizure or mini stroke.  Continue your baby dose aspirin 81 mg.  Also, I'd avoid benadryl or any other over-the-counter medications, as these can lower the seizure threshold. Try to get at least 8 hours of sleep. Avoid caffeine.   Please return if you have any worsening or recurrence of symptoms.

## 2019-07-19 NOTE — ED Provider Notes (Signed)
[facility] Emergency Department Provider Note  ____________________________________________   First MD Initiated Contact with Patient 07/19/19 1500     (approximate)  I have reviewed the triage vital signs and the nursing notes.   HISTORY  Chief Complaint Altered Mental Status    HPI [name] is a 85 y.o. female  CAD, CKD, HTN, TIA, here with AMS. Per report from grandson, pt was sitting on the couch today when she experienced acute onset of right arm and leg shaking. It was rhythmic, fast. She was not responsive. She then had a left pronator drift that has now resolved. Witness is a paramedic well versed in CVA/seizure. No facial droop. She states she just felt "out of it" and is unable to recall further details. No recent trauma, falls, or head trauma. No fever or chills.        Past Medical History:  Diagnosis Date   Anemia    CAD (coronary artery disease)    CKD (chronic kidney disease)    Diverticulosis    Essential thrombocytosis ([place]) 05/16/2015   GERD (gastroesophageal reflux disease)    GI bleed    HTN (hypertension)    NSTEMI (non-ST elevated myocardial infarction) ([place])    Osteoporosis    Thrombocytosis ([place])    TIA (transient ischemic attack)     Patient Active Problem List   Diagnosis Date Noted   TIA (transient ischemic attack) 07/19/2019   Essential thrombocytosis ([place]) 05/16/2015   Unstable angina ([place]) 04/22/2015   HTN (hypertension) 04/22/2015   GERD (gastroesophageal reflux disease) 04/22/2015   CKD (chronic kidney disease), stage IV ([place]) 04/22/2015   Thrombocytosis ([place]) 04/22/2015   History of GI bleed 04/22/2015   CAD (coronary artery disease) 04/22/2015   Macrocytic anemia 03/27/2015    Past Surgical History:  Procedure Laterality Date   ABDOMINAL HYSTERECTOMY     COLONOSCOPY      Prior to Admission medications   Medication Sig Start Date End Date Taking? Authorizing Provider    acetaminophen (TYLENOL) 325 MG tablet Take 650 mg by mouth every 4 (four) hours as needed for mild pain, moderate pain or fever.     [provider]  amLODipine (NORVASC) 5 MG tablet Take 5 mg by mouth daily.    [provider]  aspirin EC 81 MG tablet Take 81 mg by mouth daily.    [provider]  atorvastatin (LIPITOR) 10 MG tablet Take 10 mg by mouth daily.    [provider]  furosemide (LASIX) 20 MG tablet Take 20 mg by mouth once a week. 1 tab weekly    [provider]  hydroxyurea (HYDREA) 500 MG capsule One pill a day; Monday thru Friday; HOLD on Saturday/Sunday. 12/20/18   Cammie Sickle, MD  iron polysaccharides (NIFEREX) 150 MG capsule Take 150 mg by mouth daily. Alternates days with furosemide    [provider]  losartan (COZAAR) 50 MG tablet Take 50 mg by mouth at bedtime.    [provider]  metoprolol succinate (TOPROL-XL) 50 MG 24 hr tablet Take 50 mg by mouth daily.     [provider]  omeprazole (PRILOSEC) 20 MG capsule Take 1 capsule (20 mg total) by mouth 2 (two) times daily before a meal. 04/22/15   Leslye Peer, Richard, MD  potassium chloride SA (K-DUR,KLOR-CON) 20 MEQ tablet Take 20 mEq by mouth every other day. Alternates with furosemide    [provider]  vitamin B-12 (CYANOCOBALAMIN) 500 MCG tablet  Take 1,000 mcg by mouth daily.     [provider]    Allergies Alendronate sodium, Calcitonin, Cephalexin, Raloxifene, and Tramadol  Family History  Problem Relation Age of Onset   Cancer Mother    Stroke Father     Social History Social History   Tobacco Use   Smoking status: Never Smoker   Smokeless tobacco: Never Used  Substance Use Topics   Alcohol use: No   Drug use: No    Review of Systems  Review of Systems  Constitutional: Negative for fatigue and fever.  HENT: Negative for congestion and sore throat.   Eyes: Negative for visual disturbance.   Respiratory: Negative for cough and shortness of breath.   Cardiovascular: Negative for chest pain.  Gastrointestinal: Negative for abdominal pain, diarrhea, nausea and vomiting.  Genitourinary: Negative for flank pain.  Musculoskeletal: Negative for back pain and neck pain.  Skin: Negative for rash and wound.  Neurological: Positive for seizures and weakness.  Psychiatric/Behavioral: Positive for confusion.  All other systems reviewed and are negative.    ____________________________________________  PHYSICAL EXAM:      VITAL SIGNS: ED Triage Vitals [07/19/19 1431]  Enc Vitals Group     BP (!) 132/48     Pulse Rate 70     Resp 18     Temp 98 F (36.7 C)     Temp src      SpO2 96 %     Weight 93 lb 14.7 oz (42.6 kg)     Height 5\' 5"  (1.651 m)     Head Circumference      Peak Flow      Pain Score 0     Pain Loc      Pain Edu?      Excl. in [place]?      Physical Exam Vitals signs and nursing note reviewed.  Constitutional:      General: She is not in acute distress.    Appearance: She is well-developed.  HENT:     Head: Normocephalic and atraumatic.  Eyes:     Conjunctiva/sclera: Conjunctivae normal.  Neck:     Musculoskeletal: Neck supple.  Cardiovascular:     Rate and Rhythm: Normal rate and regular rhythm.     Heart sounds: Normal heart sounds.  Pulmonary:     Effort: Pulmonary effort is normal. No respiratory distress.     Breath sounds: No wheezing.  Abdominal:     General: There is no distension.  Skin:    General: Skin is warm.     Capillary Refill: Capillary refill takes less than 2 seconds.     Findings: No rash.  Neurological:     Mental Status: She is alert and oriented to person, place, and time.     Motor: No abnormal muscle tone.     Comments: Neurological Exam:  Mental Status: Alert and oriented to person, place, and time. Attention and concentration normal. Speech clear. Recent memory is intact. Cranial Nerves: Visual fields grossly intact.  EOMI and PERRLA. No nystagmus noted. Facial sensation intact at forehead, maxillary cheek, and chin/mandible bilaterally. No facial asymmetry or weakness. Hearing grossly normal. Uvula is midline, and palate elevates symmetrically. Normal SCM and trapezius strength. Tongue midline without fasciculations. Motor: Muscle strength 5/5 in proximal and distal UE and LE bilaterally. No pronator drift. Muscle tone normal. Sensation: Intact to light touch in upper and lower extremities distally bilaterally.  Gait: Normal without ataxia. Coordination: Normal FTN bilaterally.  ____________________________________________   LABS (all labs ordered are listed, but only abnormal results are displayed)  Labs Reviewed  CBC - Abnormal; Notable for the following components:      Result Value   RBC 2.65 (*)    Hemoglobin 9.5 (*)    HCT 30.1 (*)    MCV 113.6 (*)    MCH 35.8 (*)    Platelets 602 (*)    All other components within normal limits  COMPREHENSIVE METABOLIC PANEL - Abnormal; Notable for the following components:   Glucose, Bld 124 (*)    BUN 29 (*)    Creatinine, Ser 1.39 (*)    Total Protein 6.3 (*)    GFR calc non Af Amer 31 (*)    GFR calc Af Amer 36 (*)    All other components within normal limits  ETHANOL  PROTIME-INR  APTT  DIFFERENTIAL    ____________________________________________  EKG: Normal sinus rhythm, VR 70. PR 202, QRS 94, QTc 449. No acute ST-t segment changes. No ischemia or infarct. ________________________________________  RADIOLOGY All imaging, including plain films, CT scans, and ultrasounds, independently reviewed by me, and interpretations confirmed via formal radiology reads.  ED MD interpretation:   CT Head: NAICA, old cerebellar infarct  Official radiology report(s): Ct Head Wo Contrast  Result Date: 07/19/2019 CLINICAL DATA:  Intermittent left-sided weakness EXAM: CT HEAD WITHOUT CONTRAST TECHNIQUE: Contiguous axial images were obtained  from the base of the skull through the vertex without intravenous contrast. COMPARISON:  06/03/2014 FINDINGS: Brain: Mild atrophic changes and chronic white matter ischemic changes are seen. Changes of prior right cerebellar infarct new from the prior exam are seen. No findings to suggest acute hemorrhage or space-occupying mass lesion are seen. Vascular: No hyperdense vessel or unexpected calcification. Skull: Normal. Negative for fracture or focal lesion. Sinuses/Orbits: No acute finding. Other: None. IMPRESSION: Chronic atrophic changes and white matter ischemic changes without acute abnormality. Prior right cerebellar infarct. Electronically Signed   By: Inez Catalina M.D.   On: 07/19/2019 15:17    ____________________________________________  PROCEDURES   Procedure(s) performed (including Critical Care):  Procedures  ____________________________________________  INITIAL IMPRESSION / MDM / [place] / ED COURSE  As part of my medical decision making, I reviewed the following data within the [facility] notes reviewed and incorporated, Old chart reviewed, Notes from prior ED visits, and [place] Controlled Substance [place] was evaluated in Emergency Department on 07/19/2019 for the symptoms described in the history of present illness. She was evaluated in the context of the global COVID-19 pandemic, which necessitated consideration that the patient might be at risk for infection with the SARS-CoV-2 virus that causes COVID-19. Institutional protocols and algorithms that pertain to the evaluation of patients at risk for COVID-19 are in a state of rapid change based on information released by regulatory bodies including the CDC and federal and state organizations. These policies and algorithms were followed during the patient's care in the ED.  Some ED evaluations and interventions may be delayed as a result of limited staffing during the  pandemic.*     Medical Decision Making:  85 yo F here with transient R sided shaking followed by L sided weakness. Now resolved. CT head shows old cerebellar infarct, but no other acute abnormality. Lab work is very reassuring. Anemia is at baseline. CMP with baseline CKD. EKG non-ischemic. History, exam is most c/w new onset, first time seizure, with possible Todd's  paralysis. Differential includes TIA, transient hypotension with recrudescence of old deficits. Discussed diagnosis, plan with family. Initially, plan to admit but based on shared decision making discussion with pt's son and grandson, who are decision makers, and pt's expressed desire to remain home, pt elects for outpt referral to Neurology with follow-up. Feel this is reasonable based on pt's age and preferences. Continue home ASA, and I advised family to avoid benadryl or other seizure threshold-lowering medications she might be taking.   ____________________________________________  FINAL CLINICAL IMPRESSION(S) / ED DIAGNOSES  Final diagnoses:  TIA (transient ischemic attack)  Seizure-like activity ([place])     MEDICATIONS GIVEN DURING THIS VISIT:  Medications - No data to display   ED Discharge Orders    None       Note:  This document was prepared using Dragon voice recognition software and may include unintentional dictation errors.   Duffy Bruce, MD 07/19/19 [phone]

## 2019-07-19 NOTE — ED Triage Notes (Signed)
Pt to ER via EMS from home with reports from family that pt had a 5-10 minute episode of left sided weakness.  Weakness has resolved and pt has returned to baseline at this time.  Reports from EMS that pt is DNR, no form with pt at this time.

## 2019-08-07 ENCOUNTER — Inpatient Hospital Stay: Payer: Medicare Other | Attending: Internal Medicine

## 2019-08-07 NOTE — Progress Notes (Signed)
Nutrition Assessment   Reason for Assessment:   Referral from Dr B for weight loss   ASSESSMENT:  85 year old female with thrombocytosis followed by Dr. Jacinto Reap.  Past medical history of CAD, GERD, GI bleed, CKD, TIA, NSTEMI, HTN.  Spoke with patient's grandson, [name] as grandson takes care of patient.  [name] reports that he checks on her daily.  She still lives alone.  Reports good appetite and had bought a bag of Halloween candy and did not have trick or treaters and candy is gone.  Reports that her appetite has not changed. She eats cereal for breakfast, cheeseburger or hot dog or spaghetti or whatever grandson cooks for lunch and leftovers for supper.  Does not like ensure.    Grandson denies trouble chewing, swallowing, constipation, diarrhea, nausea.    Nutrition Focused Physical Exam: deferred   Medications: lasix, hydrea, prilosec, KCL, VIt B 12   Labs: reviewed   Anthropometrics:   Height: 65 inches Weight: 94 lb 14.7 oz UBW: 100lb per grandson.  Noted 108 lb on 09/22/2018 BMI: 15  13% weight loss in less than 1 year.   Grandson thinks 108 lb may have been incorrect weight   NUTRITION DIAGNOSIS: Underweight related to age, chronic illness as evidenced by 13% weight loss in less than 1 year.    INTERVENTION:  Discussed strategies to increase calories and protein. Will mail handout. Grandson asked about diary free shake alternatives and provided options Contact information will be mailed   MONITORING, EVALUATION, GOAL: No further evaluation at this time   Next Visit: Grandson to call RD if needed  [name], [place], [place] Registered Dietitian [phone] (pager)

## 2019-10-19 ENCOUNTER — Encounter: Payer: Self-pay | Admitting: Emergency Medicine

## 2019-10-19 ENCOUNTER — Emergency Department: Payer: Medicare Other

## 2019-10-19 ENCOUNTER — Emergency Department
Admission: EM | Admit: 2019-10-19 | Discharge: 2019-10-19 | Disposition: A | Discharge status: Home / Self Care | Payer: Medicare Other | Attending: Emergency Medicine | Admitting: Emergency Medicine

## 2019-10-19 ENCOUNTER — Other Ambulatory Visit: Payer: Self-pay

## 2019-10-19 DIAGNOSIS — Z7982 Long term (current) use of aspirin: Secondary | ICD-10-CM | POA: Insufficient documentation

## 2019-10-19 DIAGNOSIS — W06XXXA Fall from bed, initial encounter: Secondary | ICD-10-CM | POA: Insufficient documentation

## 2019-10-19 DIAGNOSIS — Z23 Encounter for immunization: Secondary | ICD-10-CM | POA: Diagnosis not present

## 2019-10-19 DIAGNOSIS — I2510 Atherosclerotic heart disease of native coronary artery without angina pectoris: Secondary | ICD-10-CM | POA: Insufficient documentation

## 2019-10-19 DIAGNOSIS — Y999 Unspecified external cause status: Secondary | ICD-10-CM | POA: Insufficient documentation

## 2019-10-19 DIAGNOSIS — F0390 Unspecified dementia without behavioral disturbance: Secondary | ICD-10-CM | POA: Diagnosis not present

## 2019-10-19 DIAGNOSIS — N184 Chronic kidney disease, stage 4 (severe): Secondary | ICD-10-CM | POA: Diagnosis not present

## 2019-10-19 DIAGNOSIS — I252 Old myocardial infarction: Secondary | ICD-10-CM | POA: Insufficient documentation

## 2019-10-19 DIAGNOSIS — I129 Hypertensive chronic kidney disease with stage 1 through stage 4 chronic kidney disease, or unspecified chronic kidney disease: Secondary | ICD-10-CM | POA: Diagnosis not present

## 2019-10-19 DIAGNOSIS — S0101XA Laceration without foreign body of scalp, initial encounter: Secondary | ICD-10-CM | POA: Diagnosis not present

## 2019-10-19 DIAGNOSIS — Y92003 Bedroom of unspecified non-institutional (private) residence as the place of occurrence of the external cause: Secondary | ICD-10-CM | POA: Insufficient documentation

## 2019-10-19 DIAGNOSIS — S0990XA Unspecified injury of head, initial encounter: Secondary | ICD-10-CM | POA: Diagnosis not present

## 2019-10-19 DIAGNOSIS — Y939 Activity, unspecified: Secondary | ICD-10-CM | POA: Diagnosis not present

## 2019-10-19 DIAGNOSIS — Z79899 Other long term (current) drug therapy: Secondary | ICD-10-CM | POA: Insufficient documentation

## 2019-10-19 LAB — CBC
HCT: 32.1 % — ABNORMAL LOW (ref 36.0–46.0)
Hemoglobin: 10 g/dL — ABNORMAL LOW (ref 12.0–15.0)
MCH: 35 pg — ABNORMAL HIGH (ref 26.0–34.0)
MCHC: 31.2 g/dL (ref 30.0–36.0)
MCV: 112.2 fL — ABNORMAL HIGH (ref 80.0–100.0)
Platelets: 602 10*3/uL — ABNORMAL HIGH (ref 150–400)
RBC: 2.86 MIL/uL — ABNORMAL LOW (ref 3.87–5.11)
RDW: 13.8 % (ref 11.5–15.5)
WBC: 5.9 10*3/uL (ref 4.0–10.5)
nRBC: 0 % (ref 0.0–0.2)

## 2019-10-19 LAB — BASIC METABOLIC PANEL
Anion gap: 6 (ref 5–15)
BUN: 24 mg/dL — ABNORMAL HIGH (ref 8–23)
CO2: 28 mmol/L (ref 22–32)
Calcium: 9 mg/dL (ref 8.9–10.3)
Chloride: 107 mmol/L (ref 98–111)
Creatinine, Ser: 1.2 mg/dL — ABNORMAL HIGH (ref 0.44–1.00)
GFR calc Af Amer: 44 mL/min — ABNORMAL LOW (ref >60)
GFR calc non Af Amer: 38 mL/min — ABNORMAL LOW (ref >60)
Glucose, Bld: 133 mg/dL — ABNORMAL HIGH (ref 70–99)
Potassium: 4.1 mmol/L (ref 3.5–5.1)
Sodium: 141 mmol/L (ref 135–145)

## 2019-10-19 MED ORDER — TETANUS-DIPHTH-ACELL PERTUSSIS 5-2.5-18.5 LF-MCG/0.5 IM SUSP
0.5000 mL | Freq: Once | INTRAMUSCULAR | Status: AC
  Administered 2019-10-19: 0.5 mL via INTRAMUSCULAR
  Filled 2019-10-19: qty 0.5

## 2019-10-19 MED ORDER — LIDOCAINE-EPINEPHRINE 2 %-1:100000 IJ SOLN
20.0000 mL | Freq: Once | INTRAMUSCULAR | Status: AC
  Administered 2019-10-19: 20 mL via INTRADERMAL
  Filled 2019-10-19: qty 1

## 2019-10-19 NOTE — Discharge Instructions (Addendum)
It is OK to shower. Blot the area dry.  Keep the area covered in bacitracin or antibiotic ointment, to help with healing  Follow-up in 10-14 days for staple removal

## 2019-10-19 NOTE — ED Notes (Signed)
NAD noted at time of D/C. Pt taken to lobby via wheelchair by this RN. Pt's caregiver and grandson voices understanding of D/C instructions. EDP aware of BP at time of D/C states okay for D/C at this time.

## 2019-10-19 NOTE — ED Notes (Signed)
Laceration to posterior head noted at this time, dried blood noted by this RN after cleansing of wound. Pt tolerated cleansing well. EDP at bedside at this time.

## 2019-10-19 NOTE — ED Provider Notes (Signed)
[facility] Emergency Department Provider Note  ____________________________________________   First MD Initiated Contact with Patient 10/19/19 [phone]     (approximate)  I have reviewed the triage vital signs and the nursing notes.   HISTORY  Chief Complaint Fall    HPI [name] is a 86 y.o. female with past medical history as below here with fall.  The patient reportedly fell from her bed and likely struck this bed rail.  She sustained a superficial laceration to her scalp.  Family came to check on her this morning, and found her covered in blood with a hat on.  She was normal yesterday as well as earlier this morning.  She has not had any recent fevers or chills.  She is on blood thinners.  She states she has some moderate scalp pain, but otherwise without complaints.  No neck pain.  No focal numbness or weakness of the upper or lower extremities.  No other acute complaints.  Pain worse with palpation.  No leaving factors.        Past Medical History:  Diagnosis Date  . Anemia   . CAD (coronary artery disease)   . CKD (chronic kidney disease)   . Diverticulosis   . Essential thrombocytosis ([place]) 05/16/2015  . GERD (gastroesophageal reflux disease)   . GI bleed   . HTN (hypertension)   . NSTEMI (non-ST elevated myocardial infarction) ([place])   . Osteoporosis   . Thrombocytosis ([place])   . TIA (transient ischemic attack)     Patient Active Problem List   Diagnosis Date Noted  . TIA (transient ischemic attack) 07/19/2019  . Essential thrombocytosis ([place]) 05/16/2015  . Unstable angina ([place]) 04/22/2015  . HTN (hypertension) 04/22/2015  . GERD (gastroesophageal reflux disease) 04/22/2015  . CKD (chronic kidney disease), stage IV ([place]) 04/22/2015  . Thrombocytosis ([place]) 04/22/2015  . History of GI bleed 04/22/2015  . CAD (coronary artery disease) 04/22/2015  . Macrocytic anemia 03/27/2015    Past Surgical History:  Procedure Laterality Date    . ABDOMINAL HYSTERECTOMY    . COLONOSCOPY      Prior to Admission medications   Medication Sig Start Date End Date Taking? Authorizing Provider  acetaminophen (TYLENOL) 325 MG tablet Take 650 mg by mouth every 4 (four) hours as needed for mild pain, moderate pain or fever.    Yes [provider]  amLODipine (NORVASC) 5 MG tablet Take 5 mg by mouth daily.   Yes [provider]  aspirin EC 81 MG tablet Take 81 mg by mouth daily.   Yes [provider]  atorvastatin (LIPITOR) 10 MG tablet Take 10 mg by mouth at bedtime.    Yes [provider]  furosemide (LASIX) 20 MG tablet Take 20 mg by mouth. 1 tab weekly   Yes [provider]  hydroxyurea (HYDREA) 500 MG capsule One pill a day; Monday thru Friday; HOLD on Saturday/Sunday. 12/20/18  Yes Cammie Sickle, MD  iron polysaccharides (NIFEREX) 150 MG capsule Take 150 mg by mouth daily. Alternates days with furosemide   Yes [provider]  isosorbide mononitrate (IMDUR) 30 MG 24 hr tablet Take 30 mg by mouth daily. 08/15/19  Yes [provider]  losartan (COZAAR) 50 MG tablet Take 50 mg by mouth at bedtime.   Yes [provider]  metoprolol succinate (TOPROL-XL) 50 MG 24 hr tablet Take 50 mg by mouth daily.    Yes [provider]  nitroGLYCERIN (NITROSTAT) 0.4 MG  SL tablet Place 1 tablet under the tongue as needed. 10/25/18 10/25/19 Yes [provider]  omeprazole (PRILOSEC) 20 MG capsule Take 1 capsule (20 mg total) by mouth 2 (two) times daily before a meal. 04/22/15  Yes Wieting, Richard, MD  potassium chloride SA (K-DUR,KLOR-CON) 20 MEQ tablet Take 20 mEq by mouth every other day. Alternates with furosemide   Yes [provider]  vitamin B-12 (CYANOCOBALAMIN) 500 MCG tablet Take 1,000 mcg by mouth daily.    Yes [provider]    Allergies Alendronate sodium, Calcitonin, Cephalexin, Raloxifene, and Tramadol  Family History  Problem  Relation Age of Onset  . Cancer Mother   . Stroke Father     Social History Social History   Tobacco Use  . Smoking status: Never Smoker  . Smokeless tobacco: Never Used  Substance Use Topics  . Alcohol use: No  . Drug use: No    Review of Systems  Review of Systems  Constitutional: Negative for chills and fever.  HENT: Negative for sore throat.   Respiratory: Negative for shortness of breath.   Cardiovascular: Negative for chest pain.  Gastrointestinal: Negative for abdominal pain.  Genitourinary: Negative for flank pain.  Musculoskeletal: Negative for neck pain.  Skin: Positive for wound. Negative for rash.  Allergic/Immunologic: Negative for immunocompromised state.  Neurological: Negative for weakness and numbness.  Hematological: Does not bruise/bleed easily.     ____________________________________________  PHYSICAL EXAM:      VITAL SIGNS: ED Triage Vitals [10/19/19 0958]  Enc Vitals Group     BP (!) 207/71     Pulse Rate (!) 59     Resp 18     Temp 98.3 F (36.8 C)     Temp Source Oral     SpO2 98 %     Weight 93 lb 14.7 oz (42.6 kg)     Height 5\' 2"  (1.575 m)     Head Circumference      Peak Flow      Pain Score 0     Pain Loc      Pain Edu?      Excl. in [place]?      Physical Exam Vitals and nursing note reviewed.  Constitutional:      General: She is not in acute distress.    Appearance: She is well-developed.  HENT:     Head: Normocephalic.     Comments: Approximately 2.5 cm linear laceration to the parieto-occipital scalp, with small surrounding contusion.  No exposed calvarium.  No active bleeding. Eyes:     Conjunctiva/sclera: Conjunctivae normal.  Cardiovascular:     Rate and Rhythm: Normal rate and regular rhythm.     Heart sounds: Normal heart sounds.  Pulmonary:     Effort: Pulmonary effort is normal. No respiratory distress.     Breath sounds: No wheezing.  Abdominal:     General: There is no distension.  Musculoskeletal:      Cervical back: Neck supple.  Skin:    General: Skin is warm.     Capillary Refill: Capillary refill takes less than 2 seconds.     Findings: No rash.  Neurological:     Mental Status: She is alert and oriented to person, place, and time.     Motor: No abnormal muscle tone.       ____________________________________________   LABS (all labs ordered are listed, but only abnormal results are displayed)  Labs Reviewed  CBC - Abnormal; Notable for the following components:  Result Value   RBC 2.86 (*)    Hemoglobin 10.0 (*)    HCT 32.1 (*)    MCV 112.2 (*)    MCH 35.0 (*)    Platelets 602 (*)    All other components within normal limits  BASIC METABOLIC PANEL - Abnormal; Notable for the following components:   Glucose, Bld 133 (*)    BUN 24 (*)    Creatinine, Ser 1.20 (*)    GFR calc non Af Amer 38 (*)    GFR calc Af Amer 44 (*)    All other components within normal limits    ____________________________________________  EKG: None ________________________________________  RADIOLOGY All imaging, including plain films, CT scans, and ultrasounds, independently reviewed by me, and interpretations confirmed via formal radiology reads.  ED MD interpretation:   CT Head/C-Spine: Neg  Official radiology report(s): CT Head Wo Contrast  Result Date: 10/19/2019 CLINICAL DATA:  Fall, headache EXAM: CT HEAD WITHOUT CONTRAST TECHNIQUE: Contiguous axial images were obtained from the base of the skull through the vertex without intravenous contrast. COMPARISON:  07/19/2019 FINDINGS: Brain: There is no acute intracranial hemorrhage, mass-effect, or edema. There is no new loss of gray differentiation. Confluent areas of hypoattenuation in the supratentorial white matter are nonspecific but probably reflect stable advanced chronic microvascular ischemic changes. 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 There is no extra-axial fluid collection. Prominence of the ventricles and  sulci reflects stable parenchymal volume loss. Vascular: There is atherosclerotic calcification at the skull base. Skull: Calvarium is unremarkable. Sinuses/Orbits: Patchy mucosal thickening. Visualized orbits are unremarkable. Other: Mastoid air cells are clear. IMPRESSION: No evidence of acute intracranial injury. Stable chronic findings detailed above. Electronically Signed   By: Macy Mis M.D.   On: 10/19/2019 10:36   CT Cervical Spine Wo Contrast  Result Date: 10/19/2019 CLINICAL DATA:  Fall EXAM: CT CERVICAL SPINE WITHOUT CONTRAST TECHNIQUE: Multidetector CT imaging of the cervical spine was performed without intravenous contrast. Multiplanar CT image reconstructions were also generated. COMPARISON:  None. FINDINGS: Alignment: Anteroposterior alignment is maintained. Skull base and vertebrae: No acute cervical spine fracture. Vertebral body heights are preserved. Soft tissues and spinal canal: No prevertebral fluid or swelling. No visible canal hematoma. Disc levels: Mild multilevel degenerative changes are present, greatest at C5-C6, where there is a partially calcified central disc protrusion, endplate osteophytes, and facet and uncovertebral hypertrophy with resulting moderate canal stenosis and left greater than right foraminal stenosis. Upper chest: Unremarkable. Other: None. IMPRESSION: No acute cervical spine fracture. Electronically Signed   By: Macy Mis M.D.   On: 10/19/2019 10:41    ____________________________________________  PROCEDURES   Procedure(s) performed (including Critical Care):  Marland KitchenMarland KitchenLaceration Repair  Date/Time: 10/19/2019 11:37 AM Performed by: Duffy Bruce, MD Authorized by: Duffy Bruce, MD   Consent:    Consent obtained:  Verbal   Consent given by:  Patient   Risks discussed:  Infection, need for additional repair, pain, tendon damage, retained foreign body, vascular damage, poor cosmetic result, poor wound healing and nerve damage   Alternatives  discussed:  Referral and delayed treatment Anesthesia (see MAR for exact dosages):    Anesthesia method:  Local infiltration   Local anesthetic:  Lidocaine 1% WITH epi Laceration details:    Location:  Scalp Repair type:    Repair type:  Simple Pre-procedure details:    Preparation:  Patient was prepped and draped in usual sterile fashion and imaging obtained to evaluate for foreign bodies Exploration:  Hemostasis achieved with:  Direct pressure   Wound exploration: wound explored through full range of motion and entire depth of wound probed and visualized   Treatment:    Area cleansed with:  Betadine   Amount of cleaning:  Extensive   Irrigation solution:  Sterile water   Irrigation volume:  250   Irrigation method:  Pressure wash Skin repair:    Repair method:  Staples   Number of staples:  8 Approximation:    Approximation:  Close Post-procedure details:    Dressing:  Antibiotic ointment   Patient tolerance of procedure:  Tolerated well, no immediate complications    ____________________________________________  INITIAL IMPRESSION / MDM / ASSESSMENT AND PLAN / ED COURSE  As part of my medical decision making, I reviewed the following data within the [facility] notes reviewed and incorporated, Old chart reviewed, Notes from prior ED visits, and  Controlled Substance [place] was evaluated in Emergency Department on 10/19/2019 for the symptoms described in the history of present illness. She was evaluated in the context of the global COVID-19 pandemic, which necessitated consideration that the patient might be at risk for infection with the SARS-CoV-2 virus that causes COVID-19. Institutional protocols and algorithms that pertain to the evaluation of patients at risk for COVID-19 are in a state of rapid change based on information released by regulatory bodies including the CDC and federal and state organizations. These policies  and algorithms were followed during the patient's care in the ED.  Some ED evaluations and interventions may be delayed as a result of limited staffing during the pandemic.*     Medical Decision Making: 86 year old female here with superficial laceration after mechanical fall.  CT imaging negative.  Screening lab work unremarkable and hemoglobin is at baseline.  She is at her mental baseline.  Laceration repaired.  Tetanus is up-to-date (<10 yr) for clean wound.  Discharged home.  ____________________________________________  FINAL CLINICAL IMPRESSION(S) / ED DIAGNOSES  Final diagnoses:  Injury of head, initial encounter  Laceration of scalp, initial encounter     MEDICATIONS GIVEN DURING THIS VISIT:  Medications  lidocaine-EPINEPHrine (XYLOCAINE W/EPI) 2 %-1:100000 (with pres) injection 20 mL (20 mLs Intradermal Given by Other 10/19/19 1055)     ED Discharge Orders    None       Note:  This document was prepared using Dragon voice recognition software and may include unintentional dictation errors.   Duffy Bruce, MD 10/19/19 1139

## 2019-10-19 NOTE — ED Notes (Signed)
Pt transported to CT at this time.

## 2019-10-19 NOTE — ED Triage Notes (Signed)
Patient to ER after unwitnessed fall this am. Patient has h/o dementia. Has bleeding from scalp, denies any pain anywhere.

## 2019-10-23 ENCOUNTER — Other Ambulatory Visit: Payer: Self-pay | Admitting: *Deleted

## 2019-10-23 DIAGNOSIS — D473 Essential (hemorrhagic) thrombocythemia: Secondary | ICD-10-CM

## 2019-10-23 MED ORDER — HYDROXYUREA 500 MG PO CAPS: ORAL_CAPSULE | ORAL | 3 refills | Status: AC

## 2019-11-30 DEATH — deceased

## 2020-01-16 ENCOUNTER — Inpatient Hospital Stay: Payer: Self-pay

## 2020-01-16 ENCOUNTER — Inpatient Hospital Stay: Payer: Self-pay | Admitting: Internal Medicine
